# Patient Record
Sex: Male | Born: 1960 | Hispanic: No | Marital: Married | Smoking: Never smoker
Health system: Southern US, Community
[De-identification: ages and names within clinical notes are randomized; demographics above are authoritative.]

## PROBLEM LIST (undated history)

## (undated) DIAGNOSIS — E119 Type 2 diabetes mellitus without complications: Secondary | ICD-10-CM

## (undated) DIAGNOSIS — E785 Hyperlipidemia, unspecified: Secondary | ICD-10-CM

## (undated) DIAGNOSIS — I10 Essential (primary) hypertension: Secondary | ICD-10-CM

## (undated) HISTORY — DX: Hyperlipidemia, unspecified: E78.5

## (undated) HISTORY — PX: COLONOSCOPY: SHX174

## (undated) HISTORY — DX: Type 2 diabetes mellitus without complications: E11.9

## (undated) HISTORY — PX: NO PAST SURGERIES: SHX2092

## (undated) HISTORY — PX: OTHER SURGICAL HISTORY: SHX169

---

## 2008-07-24 LAB — HM COLONOSCOPY: HM Colonoscopy: NORMAL

## 2010-06-30 ENCOUNTER — Emergency Department: Payer: Self-pay | Admitting: Emergency Medicine

## 2010-07-15 ENCOUNTER — Telehealth (INDEPENDENT_AMBULATORY_CARE_PROVIDER_SITE_OTHER): Payer: Self-pay | Admitting: *Deleted

## 2010-07-15 ENCOUNTER — Telehealth: Payer: Self-pay | Admitting: Cardiovascular Disease

## 2010-07-22 ENCOUNTER — Ambulatory Visit: Admit: 2010-07-22 | Payer: Self-pay | Admitting: Cardiovascular Disease

## 2010-08-13 NOTE — Progress Notes (Signed)
Summary: Appt  Phone Note Call from Patient Call back at Home Phone [phone]   Caller: Self Call For: Gollan Summary of Call: Pt does not want to schedule the Treadmill or a new consult appt. Initial call taken by: Harlon Flor,  July 15, 2010 11:45 AM

## 2010-08-13 NOTE — Progress Notes (Signed)
Summary: Called Pt  Phone Note Outgoing Call Call back at [facility] Phone [phone]   Call placed by: Harlon Flor,  July 15, 2010 11:34 AM Call placed to: Patient Summary of Call: LMOM TCB to schedule Treadmill and New Consult appt with Gollan. Initial call taken by: Harlon Flor,  July 15, 2010 11:35 AM     Appended Document: Called Pt Would let referring MD office know.

## 2013-04-30 ENCOUNTER — Ambulatory Visit: Payer: Self-pay | Admitting: General Practice

## 2013-05-12 ENCOUNTER — Ambulatory Visit: Payer: Self-pay | Admitting: General Practice

## 2013-06-11 ENCOUNTER — Ambulatory Visit: Payer: Self-pay | Admitting: General Practice

## 2014-07-18 LAB — HM DIABETES EYE EXAM

## 2014-08-16 ENCOUNTER — Ambulatory Visit (INDEPENDENT_AMBULATORY_CARE_PROVIDER_SITE_OTHER): Payer: BLUE CROSS/BLUE SHIELD | Admitting: Internal Medicine

## 2014-08-16 ENCOUNTER — Other Ambulatory Visit: Payer: BLUE CROSS/BLUE SHIELD

## 2014-08-16 ENCOUNTER — Encounter (INDEPENDENT_AMBULATORY_CARE_PROVIDER_SITE_OTHER): Payer: Self-pay

## 2014-08-16 ENCOUNTER — Encounter: Payer: Self-pay | Admitting: Internal Medicine

## 2014-08-16 VITALS — BP 132/80 | HR 78 | Temp 98.7°F | Resp 16 | Ht 67.0 in | Wt 149.0 lb

## 2014-08-16 DIAGNOSIS — E1165 Type 2 diabetes mellitus with hyperglycemia: Secondary | ICD-10-CM | POA: Insufficient documentation

## 2014-08-16 DIAGNOSIS — Z1159 Encounter for screening for other viral diseases: Secondary | ICD-10-CM

## 2014-08-16 DIAGNOSIS — J069 Acute upper respiratory infection, unspecified: Secondary | ICD-10-CM

## 2014-08-16 DIAGNOSIS — E11319 Type 2 diabetes mellitus with unspecified diabetic retinopathy without macular edema: Secondary | ICD-10-CM | POA: Insufficient documentation

## 2014-08-16 DIAGNOSIS — IMO0002 Reserved for concepts with insufficient information to code with codable children: Secondary | ICD-10-CM | POA: Insufficient documentation

## 2014-08-16 DIAGNOSIS — E118 Type 2 diabetes mellitus with unspecified complications: Secondary | ICD-10-CM

## 2014-08-16 DIAGNOSIS — J029 Acute pharyngitis, unspecified: Secondary | ICD-10-CM

## 2014-08-16 LAB — CBC WITH DIFFERENTIAL/PLATELET
BASOS PCT: 0.3 % (ref 0.0–3.0)
Basophils Absolute: 0 10*3/uL (ref 0.0–0.1)
EOS PCT: 1.8 % (ref 0.0–5.0)
Eosinophils Absolute: 0.1 10*3/uL (ref 0.0–0.7)
HEMATOCRIT: 47.4 % (ref 39.0–52.0)
Hemoglobin: 16.2 g/dL (ref 13.0–17.0)
LYMPHS PCT: 21.5 % (ref 12.0–46.0)
Lymphs Abs: 1 10*3/uL (ref 0.7–4.0)
MCHC: 34.2 g/dL (ref 30.0–36.0)
MCV: 88.2 fl (ref 78.0–100.0)
MONO ABS: 0.6 10*3/uL (ref 0.1–1.0)
Monocytes Relative: 13.5 % — ABNORMAL HIGH (ref 3.0–12.0)
NEUTROS ABS: 3 10*3/uL (ref 1.4–7.7)
Neutrophils Relative %: 62.9 % (ref 43.0–77.0)
Platelets: 153 10*3/uL (ref 150.0–400.0)
RBC: 5.37 Mil/uL (ref 4.22–5.81)
RDW: 13.7 % (ref 11.5–15.5)
WBC: 4.8 10*3/uL (ref 4.0–10.5)

## 2014-08-16 LAB — MICROALBUMIN / CREATININE URINE RATIO
Creatinine,U: 99.2 mg/dL
Microalb Creat Ratio: 1.2 mg/g (ref 0.0–30.0)
Microalb, Ur: 1.2 mg/dL (ref 0.0–1.9)

## 2014-08-16 LAB — COMPREHENSIVE METABOLIC PANEL
ALT: 27 U/L (ref 0–53)
AST: 18 U/L (ref 0–37)
Albumin: 4 g/dL (ref 3.5–5.2)
Alkaline Phosphatase: 107 U/L (ref 39–117)
BUN: 16 mg/dL (ref 6–23)
CO2: 34 mEq/L — ABNORMAL HIGH (ref 19–32)
Calcium: 9.6 mg/dL (ref 8.4–10.5)
Chloride: 100 mEq/L (ref 96–112)
Creatinine, Ser: 1.06 mg/dL (ref 0.40–1.50)
GFR: 76.98 mL/min (ref >60.00)
Glucose, Bld: 326 mg/dL — ABNORMAL HIGH (ref 70–99)
Potassium: 4.3 mEq/L (ref 3.5–5.1)
Sodium: 136 mEq/L (ref 135–145)
TOTAL PROTEIN: 7.2 g/dL (ref 6.0–8.3)
Total Bilirubin: 0.6 mg/dL (ref 0.2–1.2)

## 2014-08-16 LAB — HEMOGLOBIN A1C: HEMOGLOBIN A1C: 12.2 % — AB (ref 4.6–6.5)

## 2014-08-16 LAB — POCT RAPID STREP A (OFFICE): RAPID STREP A SCREEN: NEGATIVE

## 2014-08-16 LAB — HM DIABETES FOOT EXAM: HM Diabetic Foot Exam: NORMAL

## 2014-08-16 NOTE — Progress Notes (Signed)
Patient ID: George Gordon, male   DOB: [birth year], 54 y.o.   MRN: [id number] Patient Active Problem List   Diagnosis Date Noted  . Diabetes mellitus type 2 with complications 93/81/0175    Subjective:  CC:   Chief Complaint  Patient presents with  . Establish Care    HPI:   George Gordon a 54 y.o. male who presents to establish care and manage his daibetes.  He was rreferred by [employer],   TRI for 2 days , nasal drainage,  Wife had it first.  No fevers,  But had some myalgia,,  allergic to eggs,   No nausea  .  Scratchy throat,  Voice has deepened, some cough started yesterday   Father died of ruptured aortic aneurysm..  Mother died from ruptured brain aneurysm.  [place]  emigrated 5 years ago,   8 siblings,  Oct 13, 2007 sister died  Of complications from [place]  Had a CVA vs AMI 7 years earlier,   Had obesity and DM ,  Bother has DM also..    Last year diagnosed with type 2 DM with a BS of 300. Was shocked bs he had athletic entire life. Was fired for not taking medication by [facility] in [place].  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  soccer three times per week,  No lows  Was checking  blood sugars   variable readings from 130 to 220   Family History  Problem Relation Age of Onset  . Diabetes Brother      Review of Systems:  Patient denies headache, fevers, malaise, unintentional weight loss, skin rash, eye pain, sinus congestion and sinus pain, sore throat, dysphagia,  hemoptysis , cough, dyspnea, wheezing, chest pain, palpitations, orthopnea, edema, abdominal pain, nausea, melena, diarrhea, constipation, flank pain, dysuria, hematuria, urinary  Frequency, nocturia, numbness, tingling, seizures,  Focal weakness, Loss of consciousness,  Tremor, insomnia, depression, anxiety, and suicidal ideation.         Objective:  BP 132/80 mmHg  Pulse 78  Temp(Src) 98.7 F (37.1 C) (Oral)  Resp 16  Ht  5\' 7"  (1.702 m)  Wt 149 lb (67.586 kg)  BMI 23.33 kg/m2  SpO2 97%  General appearance: alert, cooperative and appears stated age Ears: normal TM's and external ear canals both ears Throat: lips, mucosa, and tongue normal; teeth and gums normal Neck: no adenopathy, no carotid bruit, supple, symmetrical, trachea midline and thyroid not enlarged, symmetric, no tenderness/mass/nodules Back: symmetric, no curvature. ROM normal. No CVA tenderness. Lungs: clear to auscultation bilaterally Heart: regular rate and rhythm, S1, S2 normal, no murmur, click, rub or gallop Abdomen: soft, non-tender; bowel sounds normal; no masses,  no organomegaly Pulses: 2+ and symmetric Skin: Skin color, texture, turgor normal. No rashes or lesions Lymph nodes: Cervical, supraclavicular, and axillary nodes normal. Foot exam:  Nails are well trimmed,  No callouses,  Sensation intact to microfilament   Assessment and Plan:  Diabetes mellitus type 2 with complications diabetes is uncontrolled currently due to patient deferring medication from former PCP a year ago. He will be brought back to start insulin.   Lab Results  Component Value Date   HGBA1C 12.2* 08/16/2014    Lab Results  Component Value Date   MICROALBUR 1.2 08/16/2014       Updated Medication List Outpatient Encounter Prescriptions as of 08/16/2014  Medication Sig  . folic acid (FOLVITE) 102 MCG  tablet Take 400 mcg by mouth daily.  . vitamin C (ASCORBIC ACID) 500 MG tablet Take 500 mg by mouth daily.  . metFORMIN (GLUCOPHAGE-XR) 500 MG 24 hr tablet Take 500 mg by mouth daily with breakfast.     Orders Placed This Encounter  Procedures  . CBC with Differential/Platelet  . Comprehensive metabolic panel  . Hepatitis C antibody  . Hemoglobin A1c  . Microalbumin / creatinine urine ratio  . POCT rapid strep A    No Follow-up on file.

## 2014-08-16 NOTE — Patient Instructions (Signed)
Diabetes and Standards of Medical Care Diabetes is complicated. You may find that your diabetes team includes a dietitian, nurse, diabetes educator, eye doctor, and more. To help everyone know what is going on and to help you get the care you deserve, the following schedule of care was developed to help keep you on track. Below are the tests, exams, vaccines, medicines, education, and plans you will need. HbA1c test This test shows how well you have controlled your glucose over the past 2-3 months. It is used to see if your diabetes management plan needs to be adjusted.   It is performed at least 2 times a year if you are meeting treatment goals.  It is performed 4 times a year if therapy has changed or if you are not meeting treatment goals. Blood pressure test  This test is performed at every routine medical visit. The goal is less than 140/90 mm Hg for most people, but 130/80 mm Hg in some cases. Ask your health care provider about your goal. Dental exam  Follow up with the dentist regularly. Eye exam  If you are diagnosed with type 1 diabetes as a child, get an exam upon reaching the age of 74 years or older and have had diabetes for 3-5 years. Yearly eye exams are recommended after that initial eye exam.  If you are diagnosed with type 1 diabetes as an adult, get an exam within 5 years of diagnosis and then yearly.  If you are diagnosed with type 2 diabetes, get an exam as soon as possible after the diagnosis and then yearly. Foot care exam  Visual foot exams are performed at every routine medical visit. The exams check for cuts, injuries, or other problems with the feet.  A comprehensive foot exam should be done yearly. This includes visual inspection as well as assessing foot pulses and testing for loss of sensation.  Check your feet nightly for cuts, injuries, or other problems with your feet. Tell your health care provider if anything is not healing. Kidney function test (urine  microalbumin)  This test is performed once a year.  Type 1 diabetes: The first test is performed 5 years after diagnosis.  Type 2 diabetes: The first test is performed at the time of diagnosis.  A serum creatinine and estimated glomerular filtration rate (eGFR) test is done once a year to assess the level of chronic kidney disease (CKD), if present. Lipid profile (cholesterol, HDL, LDL, triglycerides)  Performed every 5 years for most people.  The goal for LDL is less than 100 mg/dL. If you are at high risk, the goal is less than 70 mg/dL.  The goal for HDL is 40 mg/dL-50 mg/dL for men and 50 mg/dL-60 mg/dL for women. An HDL cholesterol of 60 mg/dL or higher gives some protection against heart disease.  The goal for triglycerides is less than 150 mg/dL. Influenza vaccine, pneumococcal vaccine, and hepatitis B vaccine  The influenza vaccine is recommended yearly.  It is recommended that people with diabetes who are over 46 years old get the pneumonia vaccine. In some cases, two separate shots may be given. Ask your health care provider if your pneumonia vaccination is up to date.  The hepatitis B vaccine is also recommended for adults with diabetes. Diabetes self-management education  Education is recommended at diagnosis and ongoing as needed. Treatment plan  Your treatment plan is reviewed at every medical visit. Document Released: 04/25/2009 Document Revised: 11/12/2013 Document Reviewed: 11/28/2012 [facility] Patient Information 2015 [place]. This information is not intended to replace advice given to you by your health care provider. Make sure you discuss any questions you have with your health care provider.   This is  my version of a  "Low GI"  Weight loss Diet:  It will allow you to lose 4 to 8  lbs  per month if you follow it carefully.  Your goal with exercise is a minimum of 30 minutes of aerobic exercise 5 days per week (Walking does not count once it becomes  easy!)     All of the foods can be found at grocery stores and in bulk at [employer].  The Atkins protein bars and shakes are available in more varieties at Target, [employer] and [place].     7 AM Breakfast:  Choose from the following:  Low carbohydrate Protein  Shakes (I recommend the EAS AdvantEdge "Carb Control" shakes, Atkins,  Muscle Milk or Premier Protein shakes  All are < 4  carbs   a scrambled egg/bacon/cheese burrito made with Mission's "carb balance" whole wheat tortilla  (about 10 net carbs )  A slice of home made fritatta (egg based dish without a crust:  google it)    Avoid cereal and bananas, oatmeal and cream of wheat and grits. They are loaded with carbohydrates!   10 AM: high protein snack  Protein bar by Atkins  Or KIND  (the snack size, under 200 cal, usually < 6 net carbs).    A stick of cheese:  Around 1 carb,  100 cal      Other so called "protein bars" and Greek yogurts tend to be loaded with carbohydrates.  Remember, in food advertising, the word "energy" is synonymous for " carbohydrate."  Lunch:   A Sandwich using the bread choices listed, Can use any  Eggs,  lunchmeat, grilled meat or canned tuna), avocado, regular mayo/mustard  and cheese.  A Salad using blue cheese, ranch,  Goddess or vinagrette,  No croutons or "confetti" and no "candied nuts" but regular nuts OK.   No pretzels or chips.  Pickles and miniature sweet peppers are a good low carb alternative that provide a "crunch"  The bread is the only source of carbohydrate in a sandwich and  can be decreased by trying some of these alternatives to traditional loaf bread  Joseph's makes a pita bread and a flat bread that are 50 cal and 4 net carbs available at [place] and [place].  This can be toasted to use with hummous as well  Toufayan makes a low carb flatbread that's 100 cal and 9 net carbs available at [employer] and [employer]J's makes 2 sizes of  Low carb whole wheat tortilla  (The large one is 210 cal  and 6 net carbs)  Flat Out makes flatbreads that are low carb as well  Avoid "Low fat dressings, as well as Barry Brunner and [place] dressings They are loaded with sugar!   3 PM/ Mid day  Snack:  Consider  1 ounce of  almonds, walnuts, pistachios, pecans, peanuts,  Macadamia nuts or a nut medley.  Avoid "granola"; the dried cranberries and raisins are loaded with carbohydrates. Mixed nuts as long as there are no raisins,  cranberries or dried fruit.    Try the prosciutto/mozzarella cheese sticks by [employer]ci  In deli /backery section   High protein   To avoid overindulging in snacks: Try drinking a glass of unsweeted almond/coconut milk  Or a  cup of coffee with your Atkins chocolate bar to keep you from having 3!!!   Pork rinds!  Yes Pork Rinds        6 PM  Dinner:     Meat/fowl/fish with a green salad, and either broccoli, cauliflower, green beans, spinach, brussel sprouts or  Lima beans. DO NOT BREAD THE PROTEIN!!      There is a low carb pasta by Dreamfield's that is acceptable and tastes great: only 5 digestible carbs/serving.( All grocery stores but BJs carry it )  There are some prepared meals that are low carb:  Try Michel Angelo's chicken piccata or chicken or eggplant parm over low carb pasta.(Lowes and BJs)   Marjory Lies Sanchez's "Carnitas" (pulled pork, no sauce,  0 carbs) or his beef pot roast to make a dinner burrito (at BJ's)  Pesto over low carb pasta (bj's sells a good quality pesto in the center refrigerated section of the deli   Try satueeing  Cheral Marker with mushroooms  Whole wheat pasta is still full of digestible carbs and  Not as low in glycemic index as Dreamfield's.   Brown rice is still rice,  So skip the rice and noodles if you eat [place] or [place] (or at least limit to 1/2 cup)  9 PM snack :   Breyer's "low carb" fudgsicle or  ice cream bar (Carb Smart line), or  Weight Watcher's ice cream bar , or another "no sugar added" ice cream;  a serving of fresh  berries/cherries with whipped cream   Cheese or DANNON'S LlGHT N FIT GREEK YOGURT,  Oikos Triple Zero greek yogurt , or Chobani 100 yogurt   8 ounces of [place] unsweetened almond/cococunut milk  Cheese and crackers (using WASA crackers,  They are low carb) or peanut butter on low carb crackers or pita bread     Avoid bananas, pineapple, grapes  and watermelon on a regular basis because they are high in sugar.  THINK OF THEM AS DESSERT  Remember that snack Substitutions should be less than 10 NET carbs per serving and meals should be < 20 net carbs. Remember that carbohydrates from fiber do not affect blood sugar, so you can  subtract fiber grams to get the "net carbs " of any particular food item.

## 2014-08-17 LAB — HEPATITIS C ANTIBODY: HCV AB: NEGATIVE

## 2014-08-18 DIAGNOSIS — J069 Acute upper respiratory infection, unspecified: Secondary | ICD-10-CM | POA: Insufficient documentation

## 2014-08-18 DIAGNOSIS — B9789 Other viral agents as the cause of diseases classified elsewhere: Secondary | ICD-10-CM

## 2014-08-18 NOTE — Assessment & Plan Note (Signed)
Rapid strep was negative ,  Doe to  Investigate sort thoroat.  Treat for symptoms.

## 2014-08-18 NOTE — Assessment & Plan Note (Addendum)
diabetes is uncontrolled currently due to patient deferring medication from former PCP a year ago. He will be brought back to start insulin.   Lab Results  Component Value Date   HGBA1C 12.2* 08/16/2014    Lab Results  Component Value Date   MICROALBUR 1.2 08/16/2014   Lab Results  Component Value Date   CREATININE 1.06 08/16/2014

## 2014-08-23 ENCOUNTER — Telehealth: Payer: Self-pay

## 2014-08-23 MED ORDER — AMOXICILLIN-POT CLAVULANATE 875-125 MG PO TABS: 1.0000 | ORAL_TABLET | Freq: Two times a day (BID) | ORAL | Status: DC

## 2014-08-23 NOTE — Telephone Encounter (Signed)
Please advise 

## 2014-08-23 NOTE — Telephone Encounter (Signed)
augmentin twice daily for 7 days,  Sent to [facility] .  Please take a probiotic ( Align, Floraque or Culturelle) while you are on the antibiotic to prevent a serious antibiotic associated diarrhea  Called clostirudium dificile colitis

## 2014-08-23 NOTE — Telephone Encounter (Signed)
Spoke with pt advised of MDs message.  Pt verbalized understanding. 

## 2014-08-23 NOTE — Telephone Encounter (Signed)
The patient called and stated he was not feeling better and is hoping an antibiotic can be called in for him. I explained our office policy, however, he stated he would like to see if a medication could be called in.

## 2014-09-06 ENCOUNTER — Ambulatory Visit (INDEPENDENT_AMBULATORY_CARE_PROVIDER_SITE_OTHER): Payer: BLUE CROSS/BLUE SHIELD | Admitting: Internal Medicine

## 2014-09-06 ENCOUNTER — Encounter: Payer: Self-pay | Admitting: Internal Medicine

## 2014-09-06 VITALS — BP 128/86 | HR 72 | Temp 98.1°F | Resp 14 | Ht 67.0 in | Wt 152.0 lb

## 2014-09-06 DIAGNOSIS — E118 Type 2 diabetes mellitus with unspecified complications: Secondary | ICD-10-CM

## 2014-09-06 DIAGNOSIS — IMO0002 Reserved for concepts with insufficient information to code with codable children: Secondary | ICD-10-CM

## 2014-09-06 DIAGNOSIS — Z23 Encounter for immunization: Secondary | ICD-10-CM

## 2014-09-06 DIAGNOSIS — E1165 Type 2 diabetes mellitus with hyperglycemia: Secondary | ICD-10-CM

## 2014-09-06 MED ORDER — METFORMIN HCL ER 500 MG PO TB24: 500.0000 mg | ORAL_TABLET | Freq: Every day | ORAL | Status: DC

## 2014-09-06 NOTE — Patient Instructions (Signed)
Resume metformin Er 500 mg once daily  continue to check sugars once or twice daily maximum .  Use fasting and post prandials as  good times to checks  E mail me in 3 weeks with blood sugars so we can adjust medication   Premiier Protein and Muscle Milk are both excellent premixed protein shakes that are great for breakfast on the run

## 2014-09-08 NOTE — Progress Notes (Signed)
Patient ID: George Gordon, male   DOB: August 30, 1960, 54 y.o.   MRN: [id number]   Patient Active Problem List   Diagnosis Date Noted  . Viral URI 08/18/2014  . Diabetes mellitus type 2, uncontrolled, with complications 87/56/4332    Subjective:  CC:   Chief Complaint  Patient presents with  . Follow-up  . Diabetes    HPI:   George Gordon is a 54 y.o. male who presents for   1 month follow up on uncontrolled diabetes.  Patient has no complaints today.  Patient is following a low glycemic index diet and checking his sugars twice daily.  Fasting sugars have been under 200 for the last 2 weeks,  And usually less than 160 most of the time and post prandials have been under 200 except on rare occasions. Patient is not exercising yet due to work load .  Patient has had an eye exam in the last 12 months and checks feet regularly for signs of infection.  Patient does not walk barefoot outside,  And denies any numbness tingling or burning in feet. Patient is up to date on all recommended vaccinations   Past Medical History  Diagnosis Date  . Diabetes mellitus without complication   . Hyperlipidemia     No past surgical history on file.     The following portions of the patient's history were reviewed and updated as appropriate: Allergies, current medications, and problem list.    Review of Systems:   Patient denies headache, fevers, malaise, unintentional weight loss, skin rash, eye pain, sinus congestion and sinus pain, sore throat, dysphagia,  hemoptysis , cough, dyspnea, wheezing, chest pain, palpitations, orthopnea, edema, abdominal pain, nausea, melena, diarrhea, constipation, flank pain, dysuria, hematuria, urinary  Frequency, nocturia, numbness, tingling, seizures,  Focal weakness, Loss of consciousness,  Tremor, insomnia, depression, anxiety, and suicidal ideation.     History   Social History  . Marital Status: Single    Spouse Name: N/A  . Number of Children: N/A  .  Years of Education: N/A   Occupational History  . Not on file.   Social History Main Topics  . Smoking status: Never Smoker   . Smokeless tobacco: Never Used  . Alcohol Use: 0.0 oz/week    0 Standard drinks or equivalent per week     Comment: Rarely  . Drug Use: Not on file  . Sexual Activity: Yes   Other Topics Concern  . Not on file   Social History Narrative    Objective:  Filed Vitals:   09/06/14 1538  BP: 128/86  Pulse: 72  Temp: 98.1 F (36.7 C)  Resp: 14     General appearance: alert, cooperative and appears stated age Ears: normal TM's and external ear canals both ears Throat: lips, mucosa, and tongue normal; teeth and gums normal Neck: no adenopathy, no carotid bruit, supple, symmetrical, trachea midline and thyroid not enlarged, symmetric, no tenderness/mass/nodules Back: symmetric, no curvature. ROM normal. No CVA tenderness. Lungs: clear to auscultation bilaterally Heart: regular rate and rhythm, S1, S2 normal, no murmur, click, rub or gallop Abdomen: soft, non-tender; bowel sounds normal; no masses,  no organomegaly Pulses: 2+ and symmetric Skin: Skin color, texture, turgor normal. No rashes or lesions Lymph nodes: Cervical, supraclavicular, and axillary nodes normal.  Assessment and Plan:  Diabetes mellitus type 2, uncontrolled, with complications Starting metformin ,  Blood sugars have already improved with attention to diet.  Return in 3 months.  Use Mychart for follow up in 3  weeks  Lab Results  Component Value Date   HGBA1C 12.2* 08/16/2014   Lab Results  Component Value Date   MICROALBUR 1.2 08/16/2014   No results found for: CHOL, HDL, LDLCALC, LDLDIRECT, TRIG, CHOLHDL     Updated Medication List Outpatient Encounter Prescriptions as of 09/06/2014  Medication Sig  . folic acid (FOLVITE) 268 MCG tablet Take 400 mcg by mouth daily.  . vitamin C (ASCORBIC ACID) 500 MG tablet Take 500 mg by mouth daily.  Marland Kitchen amoxicillin-clavulanate  (AUGMENTIN) 875-125 MG per tablet Take 1 tablet by mouth 2 (two) times daily. (Patient not taking: Reported on 09/06/2014)  . metFORMIN (GLUCOPHAGE-XR) 500 MG 24 hr tablet Take 1 tablet (500 mg total) by mouth daily with breakfast.  . [DISCONTINUED] metFORMIN (GLUCOPHAGE-XR) 500 MG 24 hr tablet Take 500 mg by mouth daily with breakfast.     Orders Placed This Encounter  Procedures  . Pneumococcal polysaccharide vaccine 23-valent greater than or equal to 2yo subcutaneous/IM    Return in about 3 months (around 12/05/2014) for follow up diabetes.

## 2014-09-08 NOTE — Assessment & Plan Note (Signed)
Starting metformin ,  Blood sugars have already improved with attention to diet.  Return in 3 months.  Use Mychart for follow up in 3 weeks  Lab Results  Component Value Date   HGBA1C 12.2* 08/16/2014   Lab Results  Component Value Date   MICROALBUR 1.2 08/16/2014   No results found for: CHOL, HDL, LDLCALC, LDLDIRECT, TRIG, CHOLHDL

## 2015-01-07 ENCOUNTER — Telehealth: Payer: Self-pay | Admitting: Internal Medicine

## 2015-01-07 NOTE — Telephone Encounter (Signed)
Pt would like to know if you will take his wife as a new pt [name]

## 2015-06-24 LAB — HM DIABETES EYE EXAM

## 2015-07-17 ENCOUNTER — Telehealth: Payer: Self-pay | Admitting: Internal Medicine

## 2015-07-17 MED ORDER — GLUCOSE BLOOD VI STRP: ORAL_STRIP | Status: DC

## 2015-07-17 NOTE — Telephone Encounter (Signed)
Pt called about needing test strips refill. Pharmacy refused to sell the strip on the flex card he was told he needs a prescription. Pt would like a call back once it's sent in. Pharmacy is [facility] [id number] - [place], [place]. Thank You!

## 2015-07-22 ENCOUNTER — Ambulatory Visit: Payer: BLUE CROSS/BLUE SHIELD | Admitting: Internal Medicine

## 2015-07-23 NOTE — Progress Notes (Signed)
Patient arrived 32 minutes late for an appointment and will be billed a no show fee.

## 2015-07-23 NOTE — Assessment & Plan Note (Signed)
Patient failed to keep scheduled appointment and will be charged a no show fee.   

## 2015-07-25 ENCOUNTER — Ambulatory Visit (INDEPENDENT_AMBULATORY_CARE_PROVIDER_SITE_OTHER): Payer: BLUE CROSS/BLUE SHIELD | Admitting: Internal Medicine

## 2015-07-25 ENCOUNTER — Encounter: Payer: Self-pay | Admitting: Internal Medicine

## 2015-07-25 VITALS — BP 118/74 | HR 67 | Temp 98.1°F | Resp 12 | Ht 67.0 in | Wt 151.0 lb

## 2015-07-25 DIAGNOSIS — E1169 Type 2 diabetes mellitus with other specified complication: Secondary | ICD-10-CM

## 2015-07-25 DIAGNOSIS — E785 Hyperlipidemia, unspecified: Secondary | ICD-10-CM

## 2015-07-25 DIAGNOSIS — E118 Type 2 diabetes mellitus with unspecified complications: Secondary | ICD-10-CM | POA: Diagnosis not present

## 2015-07-25 DIAGNOSIS — Z79899 Other long term (current) drug therapy: Secondary | ICD-10-CM

## 2015-07-25 DIAGNOSIS — E1165 Type 2 diabetes mellitus with hyperglycemia: Secondary | ICD-10-CM

## 2015-07-25 DIAGNOSIS — IMO0002 Reserved for concepts with insufficient information to code with codable children: Secondary | ICD-10-CM

## 2015-07-25 DIAGNOSIS — E559 Vitamin D deficiency, unspecified: Secondary | ICD-10-CM | POA: Diagnosis not present

## 2015-07-25 LAB — COMPREHENSIVE METABOLIC PANEL
ALT: 31 U/L (ref 0–53)
AST: 21 U/L (ref 0–37)
Albumin: 3.9 g/dL (ref 3.5–5.2)
Alkaline Phosphatase: 104 U/L (ref 39–117)
BILIRUBIN TOTAL: 0.9 mg/dL (ref 0.2–1.2)
BUN: 17 mg/dL (ref 6–23)
CALCIUM: 9.5 mg/dL (ref 8.4–10.5)
CO2: 32 meq/L (ref 19–32)
Chloride: 99 mEq/L (ref 96–112)
Creatinine, Ser: 1.01 mg/dL (ref 0.40–1.50)
GFR: 81.11 mL/min (ref >60.00)
GLUCOSE: 242 mg/dL — AB (ref 70–99)
Potassium: 4.2 mEq/L (ref 3.5–5.1)
Sodium: 138 mEq/L (ref 135–145)
Total Protein: 7 g/dL (ref 6.0–8.3)

## 2015-07-25 LAB — VITAMIN B12: Vitamin B-12: 1094 pg/mL — ABNORMAL HIGH (ref 211–911)

## 2015-07-25 LAB — LIPID PANEL
CHOL/HDL RATIO: 6
Cholesterol: 214 mg/dL — ABNORMAL HIGH (ref 0–200)
HDL: 38.1 mg/dL — AB (ref >39.00)
LDL Cholesterol: 157 mg/dL — ABNORMAL HIGH (ref 0–99)
NONHDL: 176.37
Triglycerides: 97 mg/dL (ref 0.0–149.0)
VLDL: 19.4 mg/dL (ref 0.0–40.0)

## 2015-07-25 LAB — HEMOGLOBIN A1C: Hgb A1c MFr Bld: 10.8 % — ABNORMAL HIGH (ref 4.6–6.5)

## 2015-07-25 LAB — LDL CHOLESTEROL, DIRECT: Direct LDL: 159 mg/dL

## 2015-07-25 LAB — MICROALBUMIN / CREATININE URINE RATIO
Creatinine,U: 166.7 mg/dL
Microalb Creat Ratio: 0.9 mg/g (ref 0.0–30.0)
Microalb, Ur: 1.5 mg/dL (ref 0.0–1.9)

## 2015-07-25 LAB — VITAMIN D 25 HYDROXY (VIT D DEFICIENCY, FRACTURES): VITD: 23.28 ng/mL — ABNORMAL LOW (ref 30.00–100.00)

## 2015-07-25 NOTE — Patient Instructions (Addendum)
Please Check to see whether you had the TDaP o t  Td vaccine in the last 10 years. (tetanus with or without pertussis) .  This vaccine should be give every 10 years   If your A1c is < 7.0  You will not need medications and can see me every 3 months,  If it is over 7.0,  We will start a new medication and follow up in 3 months

## 2015-07-25 NOTE — Progress Notes (Signed)
Subjective:  Patient ID: George Gordon, male    DOB: [birth year]  Age: 55 y.o. MRN: [id number]  CC: The primary encounter diagnosis was Uncontrolled type 2 diabetes mellitus with complication, without long-term current use of insulin ([place]). Diagnoses of Long-term use of high-risk medication, Vitamin D deficiency, and Hyperlipidemia due to type 2 diabetes mellitus ([place]) were also pertinent to this visit.  HPI George Gordon presents for FOLLOW UP ON TYPE 2 DM,  Uncontrolled at last visit 11 MONTHS AGO,  Has been lost to follow up  Lab Results  Component Value Date   HGBA1C 10.8* 07/25/2015     He reports that stopped metformin over a month ago due to fatigue. , and his  symptoms have improved since stopping it. Sleeping better.     Has been checking BS  twice daily,   Highest has been 263 after  A night of drinking peach schnapps. Reports that his fastings have been 100 to 140 .  Following a low glycemic index diet.  Avoids eating out. Plays soccer several times per week (3) 60 to 90 minutes   Takes zyrtec daily for allergies related to animal dander .  He has had a diabetic eye exam in the last year.    Foot exam normal     Outpatient Prescriptions Prior to Visit  Medication Sig Dispense Refill  . glucose blood test strip Use as instructed to check Blood Glucose once a day. E11.8 100 each 12  . amoxicillin-clavulanate (AUGMENTIN) 875-125 MG per tablet Take 1 tablet by mouth 2 (two) times daily. (Patient not taking: Reported on 09/06/2014) 14 tablet 0  . folic acid (FOLVITE) A999333 MCG tablet Take 400 mcg by mouth daily. Reported on 07/25/2015    . metFORMIN (GLUCOPHAGE-XR) 500 MG 24 hr tablet Take 1 tablet (500 mg total) by mouth daily with breakfast. (Patient not taking: Reported on 07/25/2015) 90 tablet 1  . vitamin C (ASCORBIC ACID) 500 MG tablet Take 500 mg by mouth daily. Reported on 07/25/2015     No facility-administered medications prior to visit.    Review of  Systems;  Patient denies headache, fevers, malaise, unintentional weight loss, skin rash, eye pain, sinus congestion and sinus pain, sore throat, dysphagia,  hemoptysis , cough, dyspnea, wheezing, chest pain, palpitations, orthopnea, edema, abdominal pain, nausea, melena, diarrhea, constipation, flank pain, dysuria, hematuria, urinary  Frequency, nocturia, numbness, tingling, seizures,  Focal weakness, Loss of consciousness,  Tremor, insomnia, depression, anxiety, and suicidal ideation.      Objective:  BP 118/74 mmHg  Pulse 67  Temp(Src) 98.1 F (36.7 C) (Oral)  Resp 12  Ht 5\' 7"  (1.702 m)  Wt 151 lb (68.493 kg)  BMI 23.64 kg/m2  SpO2 98%  BP Readings from Last 3 Encounters:  07/25/15 118/74  09/06/14 128/86  08/16/14 132/80    Wt Readings from Last 3 Encounters:  07/25/15 151 lb (68.493 kg)  09/06/14 152 lb (68.947 kg)  08/16/14 149 lb (67.586 kg)    General appearance: alert, cooperative and appears stated age Ears: normal TM's and external ear canals both ears Throat: lips, mucosa, and tongue normal; teeth and gums normal Neck: no adenopathy, no carotid bruit, supple, symmetrical, trachea midline and thyroid not enlarged, symmetric, no tenderness/mass/nodules Back: symmetric, no curvature. ROM normal. No CVA tenderness. Lungs: clear to auscultation bilaterally Heart: regular rate and rhythm, S1, S2 normal, no murmur, click, rub or gallop Abdomen: soft, non-tender; bowel sounds normal; no masses,  no organomegaly Pulses: 2+ and symmetric  Skin: Skin color, texture, turgor normal. No rashes or lesions Lymph nodes: Cervical, supraclavicular, and axillary nodes normal.  Lab Results  Component Value Date   HGBA1C 10.8* 07/25/2015   HGBA1C 12.2* 08/16/2014    Lab Results  Component Value Date   CREATININE 1.01 07/25/2015   CREATININE 1.06 08/16/2014    Lab Results  Component Value Date   WBC 4.8 08/16/2014   HGB 16.2 08/16/2014   HCT 47.4 08/16/2014   PLT 153.0  08/16/2014   GLUCOSE 242* 07/25/2015   CHOL 214* 07/25/2015   TRIG 97.0 07/25/2015   HDL 38.10* 07/25/2015   LDLDIRECT 159.0 07/25/2015   LDLCALC 157* 07/25/2015   ALT 31 07/25/2015   AST 21 07/25/2015   NA 138 07/25/2015   K 4.2 07/25/2015   CL 99 07/25/2015   CREATININE 1.01 07/25/2015   BUN 17 07/25/2015   CO2 32 07/25/2015   HGBA1C 10.8* 07/25/2015   MICROALBUR 1.5 07/25/2015    No results found.  Assessment & Plan:   Problem List Items Addressed This Visit    Diabetes mellitus type 2, uncontrolled, with complications ([place]) - Primary     diabetes is under poor control on diet alone.  Starting glipizide 5 mg twice daily and advised to return in one month for an office visit to discuss additional changes change in therapy,   And bring a log of the last week of blood sugars.  Adding ASA and advising use of statin today.   Lab Results  Component Value Date   HGBA1C 10.8* 07/25/2015    Lab Results  Component Value Date   MICROALBUR 1.5 07/25/2015         Relevant Medications   aspirin EC 81 MG tablet   atorvastatin (LIPITOR) 20 MG tablet   Other Relevant Orders   Comprehensive metabolic panel (Completed)   Hemoglobin A1c (Completed)   LDL cholesterol, direct (Completed)   Lipid panel (Completed)   Microalbumin / creatinine urine ratio (Completed)   Hyperlipidemia due to type 2 diabetes mellitus ([facility])    Given his concurrent history of diabetes mellitus , will recommend statin therapy as his 10 year risk is 22%        Relevant Medications   aspirin EC 81 MG tablet   atorvastatin (LIPITOR) 20 MG tablet    Other Visit Diagnoses    Long-term use of high-risk medication        Relevant Orders    Vitamin B12 (Completed)    Vitamin D deficiency        Relevant Orders    VITAMIN D 25 Hydroxy (Vit-D Deficiency, Fractures) (Completed)       I am having [name] start on aspirin EC and atorvastatin. I am also having him maintain his vitamin C, folic acid,  amoxicillin-clavulanate, metFORMIN, and glucose blood.  Meds ordered this encounter  Medications  . aspirin EC 81 MG tablet    Sig: Take 1 tablet (81 mg total) by mouth daily.    Dispense:  90 tablet    Refill:  3  . atorvastatin (LIPITOR) 20 MG tablet    Sig: Take 1 tablet (20 mg total) by mouth daily.    Dispense:  90 tablet    Refill:  0  A total of 25 minutes of face to face time was spent with patient more than half of which was spent in counselling about the above mentioned conditions  and coordination of care   There are no discontinued medications.  Follow-up:  Return in about 3 months (around 10/23/2015) for follow up diabetes.   Crecencio Mc, MD

## 2015-07-25 NOTE — Progress Notes (Signed)
Pre-visit discussion using our clinic review tool. No additional management support is needed unless otherwise documented below in the visit note.  

## 2015-07-27 DIAGNOSIS — E1169 Type 2 diabetes mellitus with other specified complication: Secondary | ICD-10-CM | POA: Insufficient documentation

## 2015-07-27 DIAGNOSIS — E785 Hyperlipidemia, unspecified: Secondary | ICD-10-CM

## 2015-07-27 MED ORDER — ASPIRIN EC 81 MG PO TBEC: 81.0000 mg | DELAYED_RELEASE_TABLET | Freq: Every day | ORAL | Status: DC

## 2015-07-27 MED ORDER — ATORVASTATIN CALCIUM 20 MG PO TABS: 20.0000 mg | ORAL_TABLET | Freq: Every day | ORAL | Status: DC

## 2015-07-27 NOTE — Assessment & Plan Note (Addendum)
diabetes is under poor control on diet alone.  Starting glipizide 5 mg twice daily and advised to return in one month for an office visit to discuss additional changes change in therapy,   And bring a log of the last week of blood sugars.  Adding ASA and advising use of statin today.   Lab Results  Component Value Date   HGBA1C 10.8* 07/25/2015    Lab Results  Component Value Date   MICROALBUR 1.5 07/25/2015

## 2015-07-27 NOTE — Assessment & Plan Note (Signed)
Given his concurrent history of diabetes mellitus , will recommend statin therapy as his 10 year risk is 22%

## 2015-08-18 ENCOUNTER — Telehealth: Payer: Self-pay

## 2015-08-18 MED ORDER — GLUCOSE BLOOD VI STRP: ORAL_STRIP | Status: DC

## 2015-08-18 NOTE — Telephone Encounter (Signed)
Pt states that he has a rash on the back of his neck x1week. Pt states he just started taking Lipitor 20mg , not sure if this the cause of the rash. Please advise

## 2015-08-18 NOTE — Telephone Encounter (Signed)
Have him suspend the lipitor for two weeks.  If rash resolves,  Resume the.  Lipitor.   If the  rash comes back, we know it's the lipitor.

## 2015-08-19 NOTE — Telephone Encounter (Signed)
Notified pt of Dr. Lupita Dawn comments

## 2015-08-26 ENCOUNTER — Ambulatory Visit (INDEPENDENT_AMBULATORY_CARE_PROVIDER_SITE_OTHER): Payer: BLUE CROSS/BLUE SHIELD | Admitting: Family Medicine

## 2015-08-26 ENCOUNTER — Encounter: Payer: Self-pay | Admitting: Family Medicine

## 2015-08-26 VITALS — BP 142/88 | HR 96 | Temp 100.1°F | Wt 154.0 lb

## 2015-08-26 DIAGNOSIS — J111 Influenza due to unidentified influenza virus with other respiratory manifestations: Secondary | ICD-10-CM | POA: Insufficient documentation

## 2015-08-26 LAB — POCT INFLUENZA A/B
INFLUENZA A, POC: POSITIVE — AB
INFLUENZA B, POC: POSITIVE — AB

## 2015-08-26 MED ORDER — OSELTAMIVIR PHOSPHATE 75 MG PO CAPS: 75.0000 mg | ORAL_CAPSULE | Freq: Two times a day (BID) | ORAL | Status: DC

## 2015-08-26 NOTE — Progress Notes (Signed)
   Subjective:  Patient ID: George Gordon, male    DOB: [birth year]  Age: 55 y.o. MRN: [id number]  CC: Fever, cough, body aches  HPI:  55 year old male presents to clinic today with the above complaints.  Patient states that he's been sick since Monday. He states that he developed muscle aches/body aches on Monday as well as fever and chills. He reports associated cough and nasal drainage. He also reports headache. No exacerbating or relieving factors. No medications or interventions tried. No other complaints today.  Social Hx   Social History   Social History  . Marital Status: Single    Spouse Name: N/A  . Number of Children: N/A  . Years of Education: N/A   Social History Main Topics  . Smoking status: Never Smoker   . Smokeless tobacco: Never Used  . Alcohol Use: 0.0 oz/week    0 Standard drinks or equivalent per week     Comment: Rarely  . Drug Use: None  . Sexual Activity: Yes   Other Topics Concern  . None   Social History Narrative   Review of Systems  Constitutional: Positive for fever and chills.  HENT: Positive for rhinorrhea.   Respiratory: Positive for cough.   Musculoskeletal:       Body aches.    Objective:  BP 142/88 mmHg  Pulse 96  Temp(Src) 100.1 F (37.8 C) (Oral)  Wt 154 lb (69.854 kg)  SpO2 94%  BP/Weight 08/26/2015 07/25/2015 [id number]  Systolic BP A999333 [id number]  Diastolic BP 88 74 86  Wt. (Lbs) 154 151 152  BMI 24.11 23.64 23.8   Physical Exam  Constitutional: He is oriented to person, place, and time. He appears well-developed.  NAD.   HENT:  Head: Normocephalic and atraumatic.  Mouth/Throat: Oropharynx is clear and moist.  Normal TM's bilaterally.   Neck: Neck supple.  Cardiovascular: Tachycardia present.   Pulmonary/Chest: Effort normal and breath sounds normal. No respiratory distress. He has no wheezes. He has no rales.  Neurological: He is alert and oriented to person, place, and time.  Psychiatric: He has a normal mood and  affect.  Vitals reviewed.  Lab Results  Component Value Date   WBC 4.8 08/16/2014   HGB 16.2 08/16/2014   HCT 47.4 08/16/2014   PLT 153.0 08/16/2014   GLUCOSE 242* 07/25/2015   CHOL 214* 07/25/2015   TRIG 97.0 07/25/2015   HDL 38.10* 07/25/2015   LDLDIRECT 159.0 07/25/2015   LDLCALC 157* 07/25/2015   ALT 31 07/25/2015   AST 21 07/25/2015   NA 138 07/25/2015   K 4.2 07/25/2015   CL 99 07/25/2015   CREATININE 1.01 07/25/2015   BUN 17 07/25/2015   CO2 32 07/25/2015   HGBA1C 10.8* 07/25/2015   MICROALBUR 1.5 07/25/2015    Assessment & Plan:   Problem List Items Addressed This Visit    Influenza - Primary    New problem. Rapid flu positive today. Within treatment window. Treating with Tamiflu.      Relevant Medications   oseltamivir (TAMIFLU) 75 MG capsule      Meds ordered this encounter  Medications  . oseltamivir (TAMIFLU) 75 MG capsule    Sig: Take 1 capsule (75 mg total) by mouth 2 (two) times daily.    Dispense:  10 capsule    Refill:  0   Follow-up: PRN  [place]

## 2015-08-26 NOTE — Assessment & Plan Note (Signed)
New problem. Rapid flu positive today. Within treatment window. Treating with Tamiflu.

## 2015-08-26 NOTE — Addendum Note (Signed)
Addended by: Carmin Muskrat on: 08/26/2015 04:17 PM   Modules accepted: Orders

## 2015-08-26 NOTE — Patient Instructions (Signed)
Take the tamiflu as prescribed.   Follow up as indicated.  Take care  Dr. Lacinda Axon

## 2015-08-26 NOTE — Progress Notes (Signed)
Pre visit review using our clinic review tool, if applicable. No additional management support is needed unless otherwise documented below in the visit note. 

## 2015-08-27 ENCOUNTER — Encounter: Payer: Self-pay | Admitting: Internal Medicine

## 2015-08-27 ENCOUNTER — Ambulatory Visit (INDEPENDENT_AMBULATORY_CARE_PROVIDER_SITE_OTHER): Payer: BLUE CROSS/BLUE SHIELD | Admitting: Internal Medicine

## 2015-08-27 VITALS — BP 140/80 | HR 75 | Temp 98.2°F | Resp 12 | Ht 67.0 in | Wt 148.5 lb

## 2015-08-27 DIAGNOSIS — E1165 Type 2 diabetes mellitus with hyperglycemia: Secondary | ICD-10-CM

## 2015-08-27 DIAGNOSIS — Z889 Allergy status to unspecified drugs, medicaments and biological substances status: Secondary | ICD-10-CM

## 2015-08-27 DIAGNOSIS — E1169 Type 2 diabetes mellitus with other specified complication: Secondary | ICD-10-CM

## 2015-08-27 DIAGNOSIS — J111 Influenza due to unidentified influenza virus with other respiratory manifestations: Secondary | ICD-10-CM | POA: Diagnosis not present

## 2015-08-27 DIAGNOSIS — E118 Type 2 diabetes mellitus with unspecified complications: Secondary | ICD-10-CM

## 2015-08-27 DIAGNOSIS — IMO0002 Reserved for concepts with insufficient information to code with codable children: Secondary | ICD-10-CM

## 2015-08-27 DIAGNOSIS — E785 Hyperlipidemia, unspecified: Secondary | ICD-10-CM

## 2015-08-27 DIAGNOSIS — Z789 Other specified health status: Secondary | ICD-10-CM

## 2015-08-27 DIAGNOSIS — E1140 Type 2 diabetes mellitus with diabetic neuropathy, unspecified: Secondary | ICD-10-CM

## 2015-08-27 DIAGNOSIS — E119 Type 2 diabetes mellitus without complications: Secondary | ICD-10-CM

## 2015-08-27 MED ORDER — GLIPIZIDE 5 MG PO TABS: 2.5000 mg | ORAL_TABLET | Freq: Two times a day (BID) | ORAL | Status: DC

## 2015-08-27 NOTE — Progress Notes (Signed)
Pre-visit discussion using our clinic review tool. No additional management support is needed unless otherwise documented below in the visit note.  

## 2015-08-27 NOTE — Patient Instructions (Signed)
Your blood sugars are improving but we are still not at goal. Your diet is excellent!!  I am adding glipizide 1/2 tablet (2.5 mg ) twice daily before breakfast and evening meal.  Increase the dose to 5 mg at dinner in 3 weeks  if your fastings are still > 140   Fasting labs are due on or after April 13

## 2015-08-27 NOTE — Progress Notes (Signed)
Subjective:  Patient ID: George Gordon, male    DOB: 06/29/1961  Age: 55 y.o. MRN: [id number]  CC: The primary encounter diagnosis was Diabetes mellitus without complication ([place]). Diagnoses of Influenza, Uncontrolled type 2 diabetes mellitus with complication, without long-term current use of insulin ([place]), Type 2 diabetes mellitus with diabetic neuropathy, without long-term current use of insulin ([place]), Hyperlipidemia due to type 2 diabetes mellitus ([place]), and Statin intolerance were also pertinent to this visit.  HPI George Gordon presents for diabetes follow up  He was diagnosed with flu yesterday, and started taking tamiflu last evening. . Symptoms started one day prior to presentation/  He returns today for follow up on uncontrolled diabetes and has not taken respiratory precuations by wearing a  face mask.  Staff exposed includes LPN Juliann Pulse and Administrator, sports.    Diet and blood sugars reviewed.  He is not taking metformin anymore, and no statin because a trial of lipitor caused a rash and indigestion .  His blood sugars are e;evated tp 160 in the am fasting and > 180 post prandially despite following a low GI diet. He has no low blood sugars to report.  He is exercising occasionally. Discussed  resuming glipzide 2.5 bid .    Lab Results  Component Value Date   HGBA1C 10.8* 07/25/2015     Outpatient Prescriptions Prior to Visit  Medication Sig Dispense Refill  . aspirin EC 81 MG tablet Take 1 tablet (81 mg total) by mouth daily. 90 tablet 3  . folic acid (FOLVITE) A999333 MCG tablet Take 400 mcg by mouth daily. Reported on 07/25/2015    . glucose blood test strip Use as instructed to check Blood Glucose once a day. E11.8 100 each 12  . oseltamivir (TAMIFLU) 75 MG capsule Take 1 capsule (75 mg total) by mouth 2 (two) times daily. 10 capsule 0  . vitamin C (ASCORBIC ACID) 500 MG tablet Take 500 mg by mouth daily. Reported on 07/25/2015    . amoxicillin-clavulanate (AUGMENTIN) 875-125  MG per tablet Take 1 tablet by mouth 2 (two) times daily. (Patient not taking: Reported on 08/27/2015) 14 tablet 0  . atorvastatin (LIPITOR) 20 MG tablet Take 1 tablet (20 mg total) by mouth daily. (Patient not taking: Reported on 08/27/2015) 90 tablet 0  . metFORMIN (GLUCOPHAGE-XR) 500 MG 24 hr tablet Take 1 tablet (500 mg total) by mouth daily with breakfast. (Patient not taking: Reported on 08/27/2015) 90 tablet 1   No facility-administered medications prior to visit.    Review of Systems;  Patient denies headache, fevers, malaise, unintentional weight loss, skin rash, eye pain, sinus congestion and sinus pain, sore throat, dysphagia,  hemoptysis , cough, dyspnea, wheezing, chest pain, palpitations, orthopnea, edema, abdominal pain, nausea, melena, diarrhea, constipation, flank pain, dysuria, hematuria, urinary  Frequency, nocturia, numbness, tingling, seizures,  Focal weakness, Loss of consciousness,  Tremor, insomnia, depression, anxiety, and suicidal ideation.      Objective:  BP 140/80 mmHg  Pulse 75  Temp(Src) 98.2 F (36.8 C) (Oral)  Resp 12  Ht 5\' 7"  (1.702 m)  Wt 148 lb 8 oz (67.359 kg)  BMI 23.25 kg/m2  SpO2 95%  BP Readings from Last 3 Encounters:  08/27/15 140/80  08/26/15 142/88  07/25/15 118/74    Wt Readings from Last 3 Encounters:  08/27/15 148 lb 8 oz (67.359 kg)  08/26/15 154 lb (69.854 kg)  07/25/15 151 lb (68.493 kg)    General appearance: alert, cooperative and appears stated age Ears:  normal TM's and external ear canals both ears Throat: lips, mucosa, and tongue normal; teeth and gums normal Neck: no adenopathy, no carotid bruit, supple, symmetrical, trachea midline and thyroid not enlarged, symmetric, no tenderness/mass/nodules Back: symmetric, no curvature. ROM normal. No CVA tenderness. Lungs: clear to auscultation bilaterally Heart: regular rate and rhythm, S1, S2 normal, no murmur, click, rub or gallop Abdomen: soft, non-tender; bowel sounds  normal; no masses,  no organomegaly Pulses: 2+ and symmetric Skin: Skin color, texture, turgor normal. No rashes or lesions Lymph nodes: Cervical, supraclavicular, and axillary nodes normal.  Lab Results  Component Value Date   HGBA1C 10.8* 07/25/2015   HGBA1C 12.2* 08/16/2014    Lab Results  Component Value Date   CREATININE 1.01 07/25/2015   CREATININE 1.06 08/16/2014    Lab Results  Component Value Date   WBC 4.8 08/16/2014   HGB 16.2 08/16/2014   HCT 47.4 08/16/2014   PLT 153.0 08/16/2014   GLUCOSE 242* 07/25/2015   CHOL 214* 07/25/2015   TRIG 97.0 07/25/2015   HDL 38.10* 07/25/2015   LDLDIRECT 159.0 07/25/2015   LDLCALC 157* 07/25/2015   ALT 31 07/25/2015   AST 21 07/25/2015   NA 138 07/25/2015   K 4.2 07/25/2015   CL 99 07/25/2015   CREATININE 1.01 07/25/2015   BUN 17 07/25/2015   CO2 32 07/25/2015   HGBA1C 10.8* 07/25/2015   MICROALBUR 1.5 07/25/2015    No results found.  Assessment & Plan:   Problem List Items Addressed This Visit    Diabetes mellitus type 2, uncontrolled, with complications ([place])    Metformin not tolerated.  Following a low GI diet.  Adding glipizide 2.5 mg bid.  Continue exercising. Advised to increase dose to 5 mg bid if BS are not < 140      Relevant Medications   glipiZIDE (GLUCOTROL) 5 MG tablet   Hyperlipidemia due to type 2 diabetes mellitus ([place])    Hd did not tolerate atorvastatin secondary to rash and nausea.  Lab Results  Component Value Date   CHOL 214* 07/25/2015   HDL 38.10* 07/25/2015   LDLCALC 157* 07/25/2015   LDLDIRECT 159.0 07/25/2015   TRIG 97.0 07/25/2015   CHOLHDL 6 07/25/2015         Relevant Medications   glipiZIDE (GLUCOTROL) 5 MG tablet   Diabetic neuropathy, type II diabetes mellitus ([place])    Foot exam is normal       Relevant Medications   glipiZIDE (GLUCOTROL) 5 MG tablet   Statin intolerance   Influenza    Diagnosed feb 14th        Other Visit Diagnoses    Diabetes mellitus  without complication ([place])    -  Primary    Relevant Medications    glipiZIDE (GLUCOTROL) 5 MG tablet    Other Relevant Orders    Lipid panel    Hemoglobin A1c    Comprehensive metabolic panel    LDL cholesterol, direct       I have discontinued Mr. Chess amoxicillin-clavulanate, metFORMIN, and atorvastatin. I am also having him start on glipiZIDE. Additionally, I am having him maintain his vitamin C, folic acid, aspirin EC, glucose blood, and oseltamivir.  Meds ordered this encounter  Medications  . glipiZIDE (GLUCOTROL) 5 MG tablet    Sig: Take 0.5 tablets (2.5 mg total) by mouth 2 (two) times daily before a meal.    Dispense:  60 tablet    Refill:  3   A total of 25 minutes  of face to face time was spent with patient more than half of which was spent in counselling about the above mentioned conditions  and coordination of care   Medications Discontinued During This Encounter  Medication Reason  . amoxicillin-clavulanate (AUGMENTIN) 875-125 MG per tablet Completed Course  . metFORMIN (GLUCOPHAGE-XR) 500 MG 24 hr tablet   . atorvastatin (LIPITOR) 20 MG tablet   . amoxicillin-clavulanate (AUGMENTIN) 875-125 MG per tablet Completed Course  . atorvastatin (LIPITOR) 20 MG tablet   . metFORMIN (GLUCOPHAGE-XR) 500 MG 24 hr tablet     Follow-up: Return in about 3 months (around 11/24/2015) for fastign labs after April 13 .   Crecencio Mc, MD

## 2015-08-28 NOTE — Assessment & Plan Note (Signed)
Diagnosed feb 14th

## 2015-08-30 DIAGNOSIS — E1140 Type 2 diabetes mellitus with diabetic neuropathy, unspecified: Secondary | ICD-10-CM | POA: Insufficient documentation

## 2015-08-30 DIAGNOSIS — Z789 Other specified health status: Secondary | ICD-10-CM | POA: Insufficient documentation

## 2015-08-30 NOTE — Assessment & Plan Note (Addendum)
Hd did not tolerate atorvastatin secondary to rash and nausea.  Lab Results  Component Value Date   CHOL 214* 07/25/2015   HDL 38.10* 07/25/2015   LDLCALC 157* 07/25/2015   LDLDIRECT 159.0 07/25/2015   TRIG 97.0 07/25/2015   CHOLHDL 6 07/25/2015

## 2015-08-30 NOTE — Assessment & Plan Note (Signed)
Foot exam is normal

## 2015-08-30 NOTE — Assessment & Plan Note (Addendum)
Metformin not tolerated.  Following a low GI diet.  Adding glipizide 2.5 mg bid.  Continue exercising. Advised to increase dose to 5 mg bid if BS are not < 140

## 2015-09-15 ENCOUNTER — Telehealth: Payer: Self-pay | Admitting: Internal Medicine

## 2015-09-15 MED ORDER — GLUCOSE BLOOD VI STRP: ORAL_STRIP | Status: DC

## 2015-09-15 NOTE — Telephone Encounter (Signed)
Pt would like his glucose blood test strip to be filled at [facility] in [place] off of [address]. Call t @ [phone]. Thank you!

## 2015-09-16 NOTE — Telephone Encounter (Signed)
Test strips refilled

## 2015-10-20 ENCOUNTER — Telehealth: Payer: Self-pay | Admitting: Internal Medicine

## 2015-10-20 MED ORDER — CETIRIZINE HCL 10 MG PO TABS: 10.0000 mg | ORAL_TABLET | Freq: Every day | ORAL | Status: DC

## 2015-10-20 MED ORDER — GLUCOSE BLOOD VI STRP: ORAL_STRIP | Status: DC

## 2015-10-20 NOTE — Telephone Encounter (Signed)
rx printed

## 2015-10-20 NOTE — Telephone Encounter (Signed)
Pt called to get a refill for glucose blood test strip he test his sugar twice a day and need refill for Zertec for his allergies. Pt states he needs to get a prescription so he can pay with his flex card. Pharmacy is [facility] [place], [place]. Call pt @ [phone]. Thank you!

## 2015-10-20 NOTE — Telephone Encounter (Signed)
Refill on Blood glucose strips completed.  Please advise on prescription for Zytrec.  Thanks

## 2015-10-20 NOTE — Addendum Note (Signed)
Addended by: Crecencio Mc on: 10/20/2015 03:46 PM   Modules accepted: Orders

## 2015-10-23 ENCOUNTER — Other Ambulatory Visit (INDEPENDENT_AMBULATORY_CARE_PROVIDER_SITE_OTHER): Payer: BLUE CROSS/BLUE SHIELD

## 2015-10-23 DIAGNOSIS — E119 Type 2 diabetes mellitus without complications: Secondary | ICD-10-CM | POA: Diagnosis not present

## 2015-10-23 LAB — COMPREHENSIVE METABOLIC PANEL
ALBUMIN: 4 g/dL (ref 3.5–5.2)
ALT: 34 U/L (ref 0–53)
AST: 23 U/L (ref 0–37)
Alkaline Phosphatase: 87 U/L (ref 39–117)
BUN: 19 mg/dL (ref 6–23)
CALCIUM: 9.5 mg/dL (ref 8.4–10.5)
CHLORIDE: 103 meq/L (ref 96–112)
CO2: 34 meq/L — AB (ref 19–32)
CREATININE: 1.02 mg/dL (ref 0.40–1.50)
GFR: 80.13 mL/min (ref >60.00)
Glucose, Bld: 111 mg/dL — ABNORMAL HIGH (ref 70–99)
Potassium: 4.4 mEq/L (ref 3.5–5.1)
SODIUM: 141 meq/L (ref 135–145)
TOTAL PROTEIN: 7.1 g/dL (ref 6.0–8.3)
Total Bilirubin: 0.6 mg/dL (ref 0.2–1.2)

## 2015-10-23 LAB — LIPID PANEL
CHOLESTEROL: 239 mg/dL — AB (ref 0–200)
HDL: 44.5 mg/dL (ref >39.00)
LDL Cholesterol: 178 mg/dL — ABNORMAL HIGH (ref 0–99)
NonHDL: 194.21
TRIGLYCERIDES: 80 mg/dL (ref 0.0–149.0)
Total CHOL/HDL Ratio: 5
VLDL: 16 mg/dL (ref 0.0–40.0)

## 2015-10-23 LAB — LDL CHOLESTEROL, DIRECT: Direct LDL: 157 mg/dL

## 2015-10-23 LAB — HEMOGLOBIN A1C: Hgb A1c MFr Bld: 8.1 % — ABNORMAL HIGH (ref 4.6–6.5)

## 2015-10-26 ENCOUNTER — Encounter: Payer: Self-pay | Admitting: Internal Medicine

## 2015-11-11 NOTE — Telephone Encounter (Signed)
Mailed unread message to patient.  

## 2015-11-26 ENCOUNTER — Encounter: Payer: Self-pay | Admitting: Internal Medicine

## 2015-11-26 ENCOUNTER — Ambulatory Visit (INDEPENDENT_AMBULATORY_CARE_PROVIDER_SITE_OTHER): Payer: BLUE CROSS/BLUE SHIELD | Admitting: Internal Medicine

## 2015-11-26 VITALS — BP 154/90 | HR 61 | Temp 98.4°F | Resp 12 | Ht 67.0 in | Wt 159.0 lb

## 2015-11-26 DIAGNOSIS — E1165 Type 2 diabetes mellitus with hyperglycemia: Secondary | ICD-10-CM

## 2015-11-26 DIAGNOSIS — E118 Type 2 diabetes mellitus with unspecified complications: Secondary | ICD-10-CM

## 2015-11-26 DIAGNOSIS — E785 Hyperlipidemia, unspecified: Secondary | ICD-10-CM

## 2015-11-26 DIAGNOSIS — E1140 Type 2 diabetes mellitus with diabetic neuropathy, unspecified: Secondary | ICD-10-CM

## 2015-11-26 DIAGNOSIS — IMO0002 Reserved for concepts with insufficient information to code with codable children: Secondary | ICD-10-CM

## 2015-11-26 MED ORDER — CETIRIZINE HCL 10 MG PO CAPS: ORAL_CAPSULE | ORAL | Status: DC

## 2015-11-26 NOTE — Progress Notes (Signed)
Subjective:  Patient ID: George Gordon, male    DOB: 05/02/1961  Age: 55 y.o. MRN: [id number]  CC: The primary encounter diagnosis was Uncontrolled type 2 diabetes mellitus with complication, without long-term current use of insulin (George Gordon). Diagnoses of Hyperlipidemia and Type 2 diabetes mellitus with diabetic neuropathy, without long-term current use of insulin (George Gordon) were also pertinent to this visit.  HPI George Gordon presents for 3 month follow up on uncontrolled diabetes. Last a1c was 8.1  In April  and he was asked to check fasting  sugars daily.  He has brought is BS log.  Patient has no complaints today.  Patient is following a low glycemic index diet and taking all prescribed medications regularly without side effects.  Fasting sugars have been under less than 140 most of the time and  He is playing soccer three times weekl for exercise.  Patient has had an eye exam in the last 12 months and checks feet regularly for signs of infection.  Patient does not walk barefoot outside, and does not shower barefoot in public showers.  He continues to have mild tingling or burning in feet. Patient is up to date on all recommended vaccinations.  Lab Results  Component Value Date   HGBA1C 8.1* 10/23/2015   Lab Results  Component Value Date   MICROALBUR 1.5 07/25/2015     Outpatient Prescriptions Prior to Visit  Medication Sig Dispense Refill  . aspirin EC 81 MG tablet Take 1 tablet (81 mg total) by mouth daily. 90 tablet 3  . folic acid (FOLVITE) A999333 MCG tablet Take 400 mcg by mouth daily. Reported on 07/25/2015    . glipiZIDE (GLUCOTROL) 5 MG tablet Take 0.5 tablets (2.5 mg total) by mouth 2 (two) times daily before a meal. 60 tablet 3  . glucose blood test strip Use as instructed to check Blood Glucose twice a day. E11.8 100 each 12  . oseltamivir (TAMIFLU) 75 MG capsule Take 1 capsule (75 mg total) by mouth 2 (two) times daily. 10 capsule 0  . vitamin C (ASCORBIC ACID) 500 MG tablet Take  500 mg by mouth daily. Reported on 07/25/2015    . cetirizine (ZYRTEC) 10 MG tablet Take 1 tablet (10 mg total) by mouth daily. 30 tablet 11   No facility-administered medications prior to visit.    Review of Systems;  Patient denies headache, fevers, malaise, unintentional weight loss, skin rash, eye pain, sinus congestion and sinus pain, sore throat, dysphagia,  hemoptysis , cough, dyspnea, wheezing, chest pain, palpitations, orthopnea, edema, abdominal pain, nausea, melena, diarrhea, constipation, flank pain, dysuria, hematuria, urinary  Frequency, nocturia, numbness, tingling, seizures,  Focal weakness, Loss of consciousness,  Tremor, insomnia, depression, anxiety, and suicidal ideation.      Objective:  BP 154/90 mmHg  Pulse 61  Temp(Src) 98.4 F (36.9 C) (Oral)  Resp 12  Ht 5\' 7"  (1.702 m)  Wt 159 lb (72.122 kg)  BMI 24.90 kg/m2  SpO2 97%  BP Readings from Last 3 Encounters:  11/26/15 154/90  08/27/15 140/80  08/26/15 142/88    Wt Readings from Last 3 Encounters:  11/26/15 159 lb (72.122 kg)  08/27/15 148 lb 8 oz (67.359 kg)  08/26/15 154 lb (69.854 kg)    General appearance: alert, cooperative and appears stated age Ears: normal TM's and external ear canals both ears Throat: lips, mucosa, and tongue normal; teeth and gums normal Neck: no adenopathy, no carotid bruit, supple, symmetrical, trachea midline and thyroid not enlarged, symmetric, no tenderness/mass/nodules  Back: symmetric, no curvature. ROM normal. No CVA tenderness. Lungs: clear to auscultation bilaterally Heart: regular rate and rhythm, S1, S2 normal, no murmur, click, rub or gallop Abdomen: soft, non-tender; bowel sounds normal; no masses,  no organomegaly Pulses: 2+ and symmetric Skin: Skin color, texture, turgor normal. No rashes or lesions Lymph nodes: Cervical, supraclavicular, and axillary nodes normal.  Lab Results  Component Value Date   HGBA1C 8.1* 10/23/2015   HGBA1C 10.8* 07/25/2015    HGBA1C 12.2* 08/16/2014    Lab Results  Component Value Date   CREATININE 1.02 10/23/2015   CREATININE 1.01 07/25/2015   CREATININE 1.06 08/16/2014    Lab Results  Component Value Date   WBC 4.8 08/16/2014   HGB 16.2 08/16/2014   HCT 47.4 08/16/2014   PLT 153.0 08/16/2014   GLUCOSE 111* 10/23/2015   CHOL 239* 10/23/2015   TRIG 80.0 10/23/2015   HDL 44.50 10/23/2015   LDLDIRECT 157.0 10/23/2015   LDLCALC 178* 10/23/2015   ALT 34 10/23/2015   AST 23 10/23/2015   NA 141 10/23/2015   K 4.4 10/23/2015   CL 103 10/23/2015   CREATININE 1.02 10/23/2015   BUN 19 10/23/2015   CO2 34* 10/23/2015   HGBA1C 8.1* 10/23/2015   MICROALBUR 1.5 07/25/2015    No results found.  Assessment & Plan:   Problem List Items Addressed This Visit    Diabetes mellitus type 2, uncontrolled, with complications (George Gordon) - Primary    Fasting sugars are < 140 on current regimen of glipizide 5 mg bid .  He has been asked to che 2 hour post prandials daily and submitereadings. Taking asa 81 mg,  No proteinuria.  Statin intolerance   Lab Results  Component Value Date   HGBA1C 8.1* 10/23/2015   Lab Results  Component Value Date   MICROALBUR 1.5 07/25/2015         Relevant Orders   Comprehensive metabolic panel   Hemoglobin A1c   Microalbumin / creatinine urine ratio   Lipid panel   Diabetic neuropathy, type II diabetes mellitus (George Gordon)    Foot exam is normal despite symptoms of tingling which occur at night        Other Visit Diagnoses    Hyperlipidemia        Relevant Orders    LDL cholesterol, direct       I have discontinued George Gordon cetirizine. I am also having him start on Cetirizine HCl. Additionally, I am having him maintain his vitamin C, folic acid, aspirin EC, oseltamivir, glipiZIDE, and glucose blood.  Meds ordered this encounter  Medications  . Cetirizine HCl (ZYRTEC ALLERGY) 10 MG CAPS    Sig: One tablet daily as needed for seasonal allergies    Dispense:  30 capsule      Refill:  5    Medications Discontinued During This Encounter  Medication Reason  . cetirizine (ZYRTEC) 10 MG tablet     Follow-up: Return for follow up diabetes.   Crecencio Mc, MD

## 2015-11-26 NOTE — Progress Notes (Signed)
Pre-visit discussion using our clinic review tool. No additional management support is needed unless otherwise documented below in the visit note.  

## 2015-11-26 NOTE — Patient Instructions (Addendum)
Your fasting sugars are all excellent.!  You can stop checking them   Please start checking your sugar once daily either 2 hours after lunch or  After dinner , instead of fasting   Use the low glycemic index variety of the KIND bars to prevent low blood sugars between meals.  They taste great and have only 5 g sugars     You are due for labs after July 13   I'll see you in [place]

## 2015-11-27 NOTE — Assessment & Plan Note (Signed)
Foot exam is normal despite symptoms of tingling which occur at night

## 2015-11-27 NOTE — Assessment & Plan Note (Signed)
Fasting sugars are < 140 on current regimen of glipizide 5 mg bid .  He has been asked to che 2 hour post prandials daily and submitereadings. Taking asa 81 mg,  No proteinuria.  Statin intolerance   Lab Results  Component Value Date   HGBA1C 8.1* 10/23/2015   Lab Results  Component Value Date   MICROALBUR 1.5 07/25/2015

## 2016-01-26 ENCOUNTER — Other Ambulatory Visit (INDEPENDENT_AMBULATORY_CARE_PROVIDER_SITE_OTHER): Payer: BLUE CROSS/BLUE SHIELD

## 2016-01-26 DIAGNOSIS — E785 Hyperlipidemia, unspecified: Secondary | ICD-10-CM | POA: Diagnosis not present

## 2016-01-26 DIAGNOSIS — E118 Type 2 diabetes mellitus with unspecified complications: Secondary | ICD-10-CM | POA: Diagnosis not present

## 2016-01-26 DIAGNOSIS — E1165 Type 2 diabetes mellitus with hyperglycemia: Secondary | ICD-10-CM | POA: Diagnosis not present

## 2016-01-26 DIAGNOSIS — IMO0002 Reserved for concepts with insufficient information to code with codable children: Secondary | ICD-10-CM

## 2016-01-26 LAB — COMPREHENSIVE METABOLIC PANEL
ALBUMIN: 4.1 g/dL (ref 3.5–5.2)
ALT: 25 U/L (ref 0–53)
AST: 20 U/L (ref 0–37)
Alkaline Phosphatase: 77 U/L (ref 39–117)
BILIRUBIN TOTAL: 0.8 mg/dL (ref 0.2–1.2)
BUN: 20 mg/dL (ref 6–23)
CALCIUM: 9.6 mg/dL (ref 8.4–10.5)
CHLORIDE: 103 meq/L (ref 96–112)
CO2: 33 meq/L — AB (ref 19–32)
CREATININE: 1.13 mg/dL (ref 0.40–1.50)
GFR: 71.13 mL/min (ref >60.00)
Glucose, Bld: 148 mg/dL — ABNORMAL HIGH (ref 70–99)
Potassium: 4.2 mEq/L (ref 3.5–5.1)
SODIUM: 139 meq/L (ref 135–145)
Total Protein: 7.4 g/dL (ref 6.0–8.3)

## 2016-01-26 LAB — LIPID PANEL
CHOLESTEROL: 235 mg/dL — AB (ref 0–200)
HDL: 42.2 mg/dL (ref >39.00)
LDL CALC: 176 mg/dL — AB (ref 0–99)
NonHDL: 192.69
Total CHOL/HDL Ratio: 6
Triglycerides: 81 mg/dL (ref 0.0–149.0)
VLDL: 16.2 mg/dL (ref 0.0–40.0)

## 2016-01-26 LAB — LDL CHOLESTEROL, DIRECT: LDL DIRECT: 170 mg/dL

## 2016-01-26 LAB — MICROALBUMIN / CREATININE URINE RATIO
Creatinine,U: 270.8 mg/dL
MICROALB/CREAT RATIO: 0.5 mg/g (ref 0.0–30.0)
Microalb, Ur: 1.4 mg/dL (ref 0.0–1.9)

## 2016-01-26 LAB — HEMOGLOBIN A1C: HEMOGLOBIN A1C: 7.6 % — AB (ref 4.6–6.5)

## 2016-01-29 ENCOUNTER — Ambulatory Visit (INDEPENDENT_AMBULATORY_CARE_PROVIDER_SITE_OTHER): Payer: BLUE CROSS/BLUE SHIELD | Admitting: Internal Medicine

## 2016-01-29 ENCOUNTER — Encounter: Payer: Self-pay | Admitting: Internal Medicine

## 2016-01-29 ENCOUNTER — Other Ambulatory Visit: Payer: Self-pay

## 2016-01-29 VITALS — BP 146/88 | HR 72 | Temp 98.3°F | Resp 12 | Ht 67.0 in | Wt 158.5 lb

## 2016-01-29 DIAGNOSIS — E118 Type 2 diabetes mellitus with unspecified complications: Secondary | ICD-10-CM

## 2016-01-29 DIAGNOSIS — E785 Hyperlipidemia, unspecified: Secondary | ICD-10-CM

## 2016-01-29 DIAGNOSIS — E1165 Type 2 diabetes mellitus with hyperglycemia: Secondary | ICD-10-CM | POA: Diagnosis not present

## 2016-01-29 DIAGNOSIS — E1169 Type 2 diabetes mellitus with other specified complication: Secondary | ICD-10-CM | POA: Diagnosis not present

## 2016-01-29 DIAGNOSIS — IMO0002 Reserved for concepts with insufficient information to code with codable children: Secondary | ICD-10-CM

## 2016-01-29 MED ORDER — GLUCOSE BLOOD VI STRP: ORAL_STRIP | Status: DC

## 2016-01-29 MED ORDER — GLIPIZIDE 5 MG PO TABS: ORAL_TABLET | ORAL | Status: DC

## 2016-01-29 NOTE — Telephone Encounter (Signed)
Medication refilled

## 2016-01-29 NOTE — Progress Notes (Signed)
Subjective:  Patient ID: George Gordon, male    DOB: Nov 18, 1960  Age: 55 y.o. MRN: [id number]  CC: The primary encounter diagnosis was Uncontrolled type 2 diabetes mellitus with complication, without long-term current use of insulin ([place]). A diagnosis of Hyperlipidemia due to type 2 diabetes mellitus ([place]) was also pertinent to this visit.  HPI George Gordon presents for FOLLOW UP ON UNCONTROLLED DIABETES.  He has been taking his medications as directed and checking his sugars twice daily.  No lows.  Tolerating medication,  folowing a low GI diet.  FASTIng BLOOD  SUGARS have been ELEVATED  But his post prandial sugars have been in normal range  Taking 2.5  MG GLipizide twice daily , ADVISED TO increase  his evening  dose only.    Elevated BP last 4 occasions. No home readings.  Thinks it is due to stressful work environment   Outpatient Prescriptions Prior to Visit  Medication Sig Dispense Refill  . aspirin EC 81 MG tablet Take 1 tablet (81 mg total) by mouth daily. 90 tablet 3  . Cetirizine HCl (ZYRTEC ALLERGY) 10 MG CAPS One tablet daily as needed for seasonal allergies 30 capsule 5  . folic acid (FOLVITE) A999333 MCG tablet Take 400 mcg by mouth daily. Reported on 07/25/2015    . vitamin C (ASCORBIC ACID) 500 MG tablet Take 500 mg by mouth daily. Reported on 07/25/2015    . glipiZIDE (GLUCOTROL) 5 MG tablet Take 0.5 tablets (2.5 mg total) by mouth 2 (two) times daily before a meal. 60 tablet 3  . glucose blood test strip Use as instructed to check Blood Glucose twice a day. E11.8 100 each 12  . oseltamivir (TAMIFLU) 75 MG capsule Take 1 capsule (75 mg total) by mouth 2 (two) times daily. 10 capsule 0   No facility-administered medications prior to visit.    Review of Systems;  Patient denies headache, fevers, malaise, unintentional weight loss, skin rash, eye pain, sinus congestion and sinus pain, sore throat, dysphagia,  hemoptysis , cough, dyspnea, wheezing, chest pain, palpitations,  orthopnea, edema, abdominal pain, nausea, melena, diarrhea, constipation, flank pain, dysuria, hematuria, urinary  Frequency, nocturia, numbness, tingling, seizures,  Focal weakness, Loss of consciousness,  Tremor, insomnia, depression, anxiety, and suicidal ideation.      Objective:  BP 146/88 mmHg  Pulse 72  Temp(Src) 98.3 F (36.8 C) (Oral)  Resp 12  Ht 5\' 7"  (1.702 m)  Wt 158 lb 8 oz (71.895 kg)  BMI 24.82 kg/m2  SpO2 97%  BP Readings from Last 3 Encounters:  01/29/16 146/88  11/26/15 154/90  08/27/15 140/80    Wt Readings from Last 3 Encounters:  01/29/16 158 lb 8 oz (71.895 kg)  11/26/15 159 lb (72.122 kg)  08/27/15 148 lb 8 oz (67.359 kg)    General appearance: alert, cooperative and appears stated age Ears: normal TM's and external ear canals both ears Throat: lips, mucosa, and tongue normal; teeth and gums normal Neck: no adenopathy, no carotid bruit, supple, symmetrical, trachea midline and thyroid not enlarged, symmetric, no tenderness/mass/nodules Back: symmetric, no curvature. ROM normal. No CVA tenderness. Lungs: clear to auscultation bilaterally Heart: regular rate and rhythm, S1, S2 normal, no murmur, click, rub or gallop Abdomen: soft, non-tender; bowel sounds normal; no masses,  no organomegaly Pulses: 2+ and symmetric Skin: Skin color, texture, turgor normal. No rashes or lesions Lymph nodes: Cervical, supraclavicular, and axillary nodes normal.  Lab Results  Component Value Date   HGBA1C 7.6* 01/26/2016  HGBA1C 8.1* 10/23/2015   HGBA1C 10.8* 07/25/2015    Lab Results  Component Value Date   CREATININE 1.13 01/26/2016   CREATININE 1.02 10/23/2015   CREATININE 1.01 07/25/2015    Lab Results  Component Value Date   WBC 4.8 08/16/2014   HGB 16.2 08/16/2014   HCT 47.4 08/16/2014   PLT 153.0 08/16/2014   GLUCOSE 148* 01/26/2016   CHOL 235* 01/26/2016   TRIG 81.0 01/26/2016   HDL 42.20 01/26/2016   LDLDIRECT 170.0 01/26/2016   LDLCALC 176*  01/26/2016   ALT 25 01/26/2016   AST 20 01/26/2016   NA 139 01/26/2016   K 4.2 01/26/2016   CL 103 01/26/2016   CREATININE 1.13 01/26/2016   BUN 20 01/26/2016   CO2 33* 01/26/2016   HGBA1C 7.6* 01/26/2016   MICROALBUR 1.4 01/26/2016    No results found.  Assessment & Plan:   Problem List Items Addressed This Visit    Diabetes mellitus type 2, uncontrolled, with complications ([place]) - Primary    improved but not at goal.  increase evening dose of glipizide to 5 mg .  Lab Results  Component Value Date   HGBA1C 7.6* 01/26/2016         Relevant Medications   glipiZIDE (GLUCOTROL) 5 MG tablet   Hyperlipidemia due to type 2 diabetes mellitus ([place])    .elevated due to statin intolerance. He did not tolerate atorvastatin secondary to rash and nausea.  Lab Results  Component Value Date   CHOL 235* 01/26/2016   HDL 42.20 01/26/2016   LDLCALC 176* 01/26/2016   LDLDIRECT 170.0 01/26/2016   TRIG 81.0 01/26/2016   CHOLHDL 6 01/26/2016           Relevant Medications   glipiZIDE (GLUCOTROL) 5 MG tablet     A total of 25 minutes of face to face time was spent with patient more than half of which was spent in counselling about the above mentioned conditions  and coordination of care .   I have discontinued Mr. Madren oseltamivir and glucose blood. I have also changed his glipiZIDE. Additionally, I am having him maintain his vitamin C, folic acid, aspirin EC, and Cetirizine HCl.  Meds ordered this encounter  Medications  . DISCONTD: glucose blood test strip    Sig: Use as instructed to check Blood Glucose twice a day. E11.8    Dispense:  100 each    Refill:  12  . glipiZIDE (GLUCOTROL) 5 MG tablet    Sig: Take 0.5 tablet (2.5 mg) in the morning and 1 tablet ( 5 mg) in the evening.    Dispense:  60 tablet    Refill:  3    Medications Discontinued During This Encounter  Medication Reason  . oseltamivir (TAMIFLU) 75 MG capsule Completed Course  . glucose blood test  strip Reorder  . glipiZIDE (GLUCOTROL) 5 MG tablet Reorder    Follow-up: Return in about 3 months (around 04/30/2016) for follow up diabetes labs on or after oct 18.   Crecencio Mc, MD

## 2016-01-29 NOTE — Progress Notes (Signed)
Pre-visit discussion using our clinic review tool. No additional management support is needed unless otherwise documented below in the visit note.  

## 2016-01-29 NOTE — Patient Instructions (Signed)
  Your a1c has improved greatly,   to 7.8,   but we still have work to do to get it < 7.0   Please increase your evening dose of glipizde to 5 mg .  Continue 2.5 mg in the morning.  If you start having low blood sugars (<80) , resume prior dose and let me know  Please have your eye doctor send me a report on your last eye exam  Please check your blood pressure 3 ties ove rthe next 2 weeks and let me know your readings.

## 2016-02-01 NOTE — Assessment & Plan Note (Signed)
.  elevated due to statin intolerance. He did not tolerate atorvastatin secondary to rash and nausea.  Lab Results  Component Value Date   CHOL 235* 01/26/2016   HDL 42.20 01/26/2016   LDLCALC 176* 01/26/2016   LDLDIRECT 170.0 01/26/2016   TRIG 81.0 01/26/2016   CHOLHDL 6 01/26/2016

## 2016-02-01 NOTE — Assessment & Plan Note (Signed)
improved but not at goal.  increase evening dose of glipizide to 5 mg .  Lab Results  Component Value Date   HGBA1C 7.6* 01/26/2016

## 2016-02-16 NOTE — Telephone Encounter (Signed)
Mailed unread message to patient. thanks 

## 2016-05-05 ENCOUNTER — Encounter: Payer: Self-pay | Admitting: Internal Medicine

## 2016-05-05 ENCOUNTER — Ambulatory Visit (INDEPENDENT_AMBULATORY_CARE_PROVIDER_SITE_OTHER): Payer: BLUE CROSS/BLUE SHIELD | Admitting: Internal Medicine

## 2016-05-05 VITALS — BP 150/84 | HR 65 | Temp 97.6°F | Resp 12 | Ht 67.0 in | Wt 161.0 lb

## 2016-05-05 DIAGNOSIS — E118 Type 2 diabetes mellitus with unspecified complications: Secondary | ICD-10-CM

## 2016-05-05 DIAGNOSIS — E1169 Type 2 diabetes mellitus with other specified complication: Secondary | ICD-10-CM

## 2016-05-05 DIAGNOSIS — E785 Hyperlipidemia, unspecified: Secondary | ICD-10-CM | POA: Diagnosis not present

## 2016-05-05 DIAGNOSIS — I10 Essential (primary) hypertension: Secondary | ICD-10-CM | POA: Diagnosis not present

## 2016-05-05 DIAGNOSIS — E1165 Type 2 diabetes mellitus with hyperglycemia: Secondary | ICD-10-CM | POA: Diagnosis not present

## 2016-05-05 DIAGNOSIS — IMO0002 Reserved for concepts with insufficient information to code with codable children: Secondary | ICD-10-CM

## 2016-05-05 DIAGNOSIS — E1140 Type 2 diabetes mellitus with diabetic neuropathy, unspecified: Secondary | ICD-10-CM

## 2016-05-05 LAB — COMPREHENSIVE METABOLIC PANEL
ALBUMIN: 4.1 g/dL (ref 3.5–5.2)
ALK PHOS: 88 U/L (ref 39–117)
ALT: 27 U/L (ref 0–53)
AST: 22 U/L (ref 0–37)
BUN: 17 mg/dL (ref 6–23)
CO2: 33 mEq/L — ABNORMAL HIGH (ref 19–32)
CREATININE: 1.1 mg/dL (ref 0.40–1.50)
Calcium: 9.7 mg/dL (ref 8.4–10.5)
Chloride: 103 mEq/L (ref 96–112)
GFR: 73.3 mL/min (ref >60.00)
GLUCOSE: 127 mg/dL — AB (ref 70–99)
Potassium: 4.6 mEq/L (ref 3.5–5.1)
SODIUM: 139 meq/L (ref 135–145)
TOTAL PROTEIN: 7.5 g/dL (ref 6.0–8.3)
Total Bilirubin: 0.9 mg/dL (ref 0.2–1.2)

## 2016-05-05 LAB — LIPID PANEL
CHOL/HDL RATIO: 5
Cholesterol: 242 mg/dL — ABNORMAL HIGH (ref 0–200)
HDL: 49.5 mg/dL (ref >39.00)
LDL CALC: 174 mg/dL — AB (ref 0–99)
NONHDL: 192.29
TRIGLYCERIDES: 89 mg/dL (ref 0.0–149.0)
VLDL: 17.8 mg/dL (ref 0.0–40.0)

## 2016-05-05 LAB — LDL CHOLESTEROL, DIRECT: Direct LDL: 186 mg/dL

## 2016-05-05 LAB — HEMOGLOBIN A1C: HEMOGLOBIN A1C: 7.2 % — AB (ref 4.6–6.5)

## 2016-05-05 MED ORDER — LISINOPRIL 5 MG PO TABS: 5.0000 mg | ORAL_TABLET | Freq: Every day | ORAL | 0 refills | Status: DC

## 2016-05-05 NOTE — Progress Notes (Signed)
Pre-visit discussion using our clinic review tool. No additional management support is needed unless otherwise documented below in the visit note.  

## 2016-05-05 NOTE — Progress Notes (Signed)
Subjective:  Patient ID: George Gordon, male    DOB: [birth year]  Age: 55 y.o. MRN: [id number]  CC: The primary encounter diagnosis was Uncontrolled type 2 diabetes mellitus with complication, without long-term current use of insulin ([place]). Diagnoses of Hyperlipidemia due to type 2 diabetes mellitus ([place]), Essential hypertension, and Type 2 diabetes mellitus with diabetic neuropathy, without long-term current use of insulin ([place]) were also pertinent to this visit.  HPI George Gordon presents for   diabetes folllow  up .  He feels generally well, is exercising several times per week and checking blood sugars twice daily at variable times.  BS have been under 130 fasting and < 150 post prandially.  Denies any recent hypoglyemic events.  Taking his medications as directed. Following a carbohydrate modified diet 6 days per week. Denies numbness, burning and tingling of extremities. Appetite is good.   Has eliminiated semolina from diet.  B  Getting flu vaccine at school   Has had TDap also in the last 10 years .  BP elevated today.     Lab Results  Component Value Date   HGBA1C 7.2 (H) 05/05/2016      Outpatient Medications Prior to Visit  Medication Sig Dispense Refill  . aspirin EC 81 MG tablet Take 1 tablet (81 mg total) by mouth daily. 90 tablet 3  . Cetirizine HCl (ZYRTEC ALLERGY) 10 MG CAPS One tablet daily as needed for seasonal allergies 30 capsule 5  . folic acid (FOLVITE) A999333 MCG tablet Take 400 mcg by mouth daily. Reported on 07/25/2015    . glipiZIDE (GLUCOTROL) 5 MG tablet Take 0.5 tablet (2.5 mg) in the morning and 1 tablet ( 5 mg) in the evening. 60 tablet 3  . glucose blood test strip Use as instructed to check Blood Glucose twice a day. E11.8 100 each 12  . vitamin C (ASCORBIC ACID) 500 MG tablet Take 500 mg by mouth daily. Reported on 07/25/2015     No facility-administered medications prior to visit.     Review of Systems;  Patient denies headache, fevers,  malaise, unintentional weight loss, skin rash, eye pain, sinus congestion and sinus pain, sore throat, dysphagia,  hemoptysis , cough, dyspnea, wheezing, chest pain, palpitations, orthopnea, edema, abdominal pain, nausea, melena, diarrhea, constipation, flank pain, dysuria, hematuria, urinary  Frequency, nocturia, numbness, tingling, seizures,  Focal weakness, Loss of consciousness,  Tremor, insomnia, depression, anxiety, and suicidal ideation.      Objective:  BP (!) 150/84   Pulse 65   Temp 97.6 F (36.4 C) (Oral)   Resp 12   Ht 5\' 7"  (1.702 m)   Wt 161 lb (73 kg)   SpO2 97%   BMI 25.22 kg/m   BP Readings from Last 3 Encounters:  05/05/16 (!) 150/84  01/29/16 (!) 146/88  11/26/15 (!) 154/90    Wt Readings from Last 3 Encounters:  05/05/16 161 lb (73 kg)  01/29/16 158 lb 8 oz (71.9 kg)  11/26/15 159 lb (72.1 kg)    General appearance: alert, cooperative and appears stated age Ears: normal TM's and external ear canals both ears Throat: lips, mucosa, and tongue normal; teeth and gums normal Neck: no adenopathy, no carotid bruit, supple, symmetrical, trachea midline and thyroid not enlarged, symmetric, no tenderness/mass/nodules Back: symmetric, no curvature. ROM normal. No CVA tenderness. Lungs: clear to auscultation bilaterally Heart: regular rate and rhythm, S1, S2 normal, no murmur, click, rub or gallop Abdomen: soft, non-tender; bowel sounds normal; no masses,  no organomegaly Pulses:  2+ and symmetric Skin: Skin color, texture, turgor normal. No rashes or lesions Lymph nodes: Cervical, supraclavicular, and axillary nodes normal.  Lab Results  Component Value Date   HGBA1C 7.2 (H) 05/05/2016   HGBA1C 7.6 (H) 01/26/2016   HGBA1C 8.1 (H) 10/23/2015    Lab Results  Component Value Date   CREATININE 1.10 05/05/2016   CREATININE 1.13 01/26/2016   CREATININE 1.02 10/23/2015    Lab Results  Component Value Date   WBC 4.8 08/16/2014   HGB 16.2 08/16/2014   HCT 47.4  08/16/2014   PLT 153.0 08/16/2014   GLUCOSE 127 (H) 05/05/2016   CHOL 242 (H) 05/05/2016   TRIG 89.0 05/05/2016   HDL 49.50 05/05/2016   LDLDIRECT 186.0 05/05/2016   LDLCALC 174 (H) 05/05/2016   ALT 27 05/05/2016   AST 22 05/05/2016   NA 139 05/05/2016   K 4.6 05/05/2016   CL 103 05/05/2016   CREATININE 1.10 05/05/2016   BUN 17 05/05/2016   CO2 33 (H) 05/05/2016   HGBA1C 7.2 (H) 05/05/2016   MICROALBUR 1.4 01/26/2016    No results found.  Assessment & Plan:   Problem List Items Addressed This Visit    Diabetes mellitus type 2, uncontrolled, with complications ([place]) - Primary    Improving control with low GI diet, glipizide  and exercise .  hemoglobin A1c is now 7.2  Advised to adjust glipzide dose.  . Patient is referred for  eye exam and foot exam was done today.  There is  no proteinuria on prior micro urinalysis .  Fasting lipids will be repeated  and statin therapy advised if indicated by application of new ACC guidelines based on patient's 10 year risk of CAD   Lab Results  Component Value Date   HGBA1C 7.2 (H) 05/05/2016   Lab Results  Component Value Date   MICROALBUR 1.4 01/26/2016   Lab Results  Component Value Date   CHOL 242 (H) 05/05/2016   HDL 49.50 05/05/2016   LDLCALC 174 (H) 05/05/2016   LDLDIRECT 186.0 05/05/2016   TRIG 89.0 05/05/2016   CHOLHDL 5 05/05/2016          Relevant Medications   lisinopril (PRINIVIL,ZESTRIL) 5 MG tablet   Other Relevant Orders   Comprehensive metabolic panel (Completed)   Hemoglobin A1c (Completed)   Lipid panel (Completed)   Hyperlipidemia due to type 2 diabetes mellitus ([place])    .elevated due to statin intolerance. He did not tolerate atorvastatin secondary to rash and nausea.  Lab Results  Component Value Date   CHOL 242 (H) 05/05/2016   HDL 49.50 05/05/2016   LDLCALC 174 (H) 05/05/2016   LDLDIRECT 186.0 05/05/2016   TRIG 89.0 05/05/2016   CHOLHDL 5 05/05/2016           Relevant Medications    lisinopril (PRINIVIL,ZESTRIL) 5 MG tablet   Other Relevant Orders   LDL cholesterol, direct (Completed)   Diabetic neuropathy, type II diabetes mellitus ([place])    Foot exam done .  No allouses       Relevant Medications   lisinopril (PRINIVIL,ZESTRIL) 5 MG tablet   Essential hypertension    Adding lisinopril today. He will follow up at [facility] for one week BMET and BP check       Relevant Medications   lisinopril (PRINIVIL,ZESTRIL) 5 MG tablet   Other Relevant Orders   Basic metabolic panel    Other Visit Diagnoses   None.     I am having Mr. Bauermeister  start on lisinopril. I am also having him maintain his vitamin C, folic acid, aspirin EC, Cetirizine HCl, glipiZIDE, and glucose blood.  Meds ordered this encounter  Medications  . lisinopril (PRINIVIL,ZESTRIL) 5 MG tablet    Sig: Take 1 tablet (5 mg total) by mouth daily.    Dispense:  90 tablet    Refill:  0    There are no discontinued medications.  Follow-up: Return in about 3 months (around 08/05/2016) for follow up diabetes.   Crecencio Mc, MD

## 2016-05-05 NOTE — Patient Instructions (Addendum)
You can add back semolina to your diet.  Continue 5 mg glipizide at night if having semolina with dinner.  Otherwise 2.5 mg dose at  [facility] morning glipzide 2.5 mg dose  if exercising  That morning,  or if fasting  was < 120   Follow this for 2 weeks so we can observe the pattern   Check with school about Tetanus or TdaP (tetanus plus pertussis ) vaccine (due every 10 years)    I am starting you on lisinopril 5 mg daily for your elevated blood pressure  Please return in one week after you start it for a repeat blood check (brig home machine for BP check)

## 2016-05-07 ENCOUNTER — Telehealth: Payer: Self-pay | Admitting: Internal Medicine

## 2016-05-07 NOTE — Telephone Encounter (Signed)
Pt is cancelling his lab appt for 10/30. He wishes to have his labs done at [facility] clinic. Please advise, thank you!  Call pt @ [phone]

## 2016-05-07 NOTE — Telephone Encounter (Signed)
Was patient to come back for labs also requesting order for BP check at [facility] clinic, I will right up just let me know labs.

## 2016-05-07 NOTE — Telephone Encounter (Signed)
Letter for BMET and BP check to be doen at [facility] printed

## 2016-05-07 NOTE — Telephone Encounter (Signed)
Pt states that he needs in writing what Dr. Derrel Nip would like done. He cancelled both his lab and bp check appt.

## 2016-05-08 DIAGNOSIS — I10 Essential (primary) hypertension: Secondary | ICD-10-CM | POA: Insufficient documentation

## 2016-05-08 NOTE — Assessment & Plan Note (Signed)
Adding lisinopril today. He will follow up at [facility] for one week BMET and BP check

## 2016-05-08 NOTE — Assessment & Plan Note (Signed)
Foot exam done .  No allouses

## 2016-05-08 NOTE — Assessment & Plan Note (Signed)
.  elevated due to statin intolerance. He did not tolerate atorvastatin secondary to rash and nausea.  Lab Results  Component Value Date   CHOL 242 (H) 05/05/2016   HDL 49.50 05/05/2016   LDLCALC 174 (H) 05/05/2016   LDLDIRECT 186.0 05/05/2016   TRIG 89.0 05/05/2016   CHOLHDL 5 05/05/2016

## 2016-05-08 NOTE — Assessment & Plan Note (Addendum)
Improving control with low GI diet, glipizide  and exercise .  hemoglobin A1c is now 7.2  Advised to adjust glipzide dose.  . Patient is referred for  eye exam and foot exam was done today.  There is  no proteinuria on prior micro urinalysis .  Fasting lipids will be repeated  and statin therapy advised if indicated by application of new ACC guidelines based on patient's 10 year risk of CAD   Lab Results  Component Value Date   HGBA1C 7.2 (H) 05/05/2016   Lab Results  Component Value Date   MICROALBUR 1.4 01/26/2016   Lab Results  Component Value Date   CHOL 242 (H) 05/05/2016   HDL 49.50 05/05/2016   LDLCALC 174 (H) 05/05/2016   LDLDIRECT 186.0 05/05/2016   TRIG 89.0 05/05/2016   CHOLHDL 5 05/05/2016

## 2016-05-10 ENCOUNTER — Other Ambulatory Visit: Payer: BLUE CROSS/BLUE SHIELD

## 2016-05-11 NOTE — Telephone Encounter (Signed)
[place] and Staff fax [phone].  Please call patient when faxed.  Call pt @ [phone]

## 2016-05-11 NOTE — Telephone Encounter (Signed)
Reprinted letter for signature.

## 2016-05-12 NOTE — Telephone Encounter (Signed)
Letter signed and faxed to [facility]. Patient notif, ied.

## 2016-05-14 LAB — BASIC METABOLIC PANEL
BUN: 20 mg/dL (ref 4–21)
CREATININE: 1.2 mg/dL (ref 0.6–1.3)
GLUCOSE: 126 mg/dL
POTASSIUM: 4.2 mmol/L (ref 3.4–5.3)
Sodium: 141 mmol/L (ref 137–147)

## 2016-05-16 ENCOUNTER — Telehealth: Payer: Self-pay | Admitting: Internal Medicine

## 2016-05-16 NOTE — Telephone Encounter (Signed)
Repeat labs are normal. Continue current medications,  Report BP when available from [facility]

## 2016-05-17 ENCOUNTER — Encounter: Payer: Self-pay | Admitting: *Deleted

## 2016-05-17 NOTE — Telephone Encounter (Signed)
Letter mailed

## 2016-05-19 ENCOUNTER — Encounter: Payer: Self-pay | Admitting: Internal Medicine

## 2016-05-22 ENCOUNTER — Ambulatory Visit (HOSPITAL_COMMUNITY)
Admission: EM | Admit: 2016-05-22 | Discharge: 2016-05-22 | Disposition: A | Discharge status: Home / Self Care | Payer: BLUE CROSS/BLUE SHIELD | Attending: Family Medicine | Admitting: Family Medicine

## 2016-05-22 ENCOUNTER — Encounter (HOSPITAL_COMMUNITY): Payer: Self-pay | Admitting: Emergency Medicine

## 2016-05-22 DIAGNOSIS — K625 Hemorrhage of anus and rectum: Secondary | ICD-10-CM | POA: Diagnosis not present

## 2016-05-22 MED ORDER — OMEPRAZOLE 20 MG PO CPDR: 20.0000 mg | DELAYED_RELEASE_CAPSULE | Freq: Every day | ORAL | 1 refills | Status: DC

## 2016-05-22 NOTE — ED Triage Notes (Signed)
Noticed this morning.  Reports toilet bowl full of blood.  Denies constipation this morning.  Normal, soft stool.  This is the first time for having this issue.  Reports abdomen was grumbling and having a lot of gas.  Yesterday ate yogurt and apple pie.  Before bed had a cheeseburger.  Denies urinary issues

## 2016-05-22 NOTE — ED Provider Notes (Signed)
[facility]    CSN: [id number] Arrival date & time: 05/22/16  1223     History   Chief Complaint Chief Complaint  Patient presents with  . Rectal Bleeding    HPI George Gordon is a 55 y.o. male.   This a 55 year old male who describes passing bright red blood with a bowel movement this morning. He's had a colonoscopy about 7 years ago but there were no unusual findings at that time. He's ever had any pain with his bowel movements. He's not had a history of hemorrhoids, nor has he had problems with bleeding before.   patient denies any abdominal pain at this point.  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      Past Medical History:  Diagnosis Date  . Diabetes mellitus without complication ([place])   . Hyperlipidemia     Patient Active Problem List   Diagnosis Date Noted  . Essential hypertension 05/08/2016  . Diabetic neuropathy, type II diabetes mellitus ([place]) 08/30/2015  . Statin intolerance 08/30/2015  . Hyperlipidemia due to type 2 diabetes mellitus ([place]) 07/27/2015  . Diabetes mellitus type 2, uncontrolled, with complications ([place]) [id number]    History reviewed. No pertinent surgical history.     Home Medications    Prior to Admission medications   Medication Sig Start Date End Date Taking? Authorizing Provider  folic acid (FOLVITE) A999333 MCG tablet Take 400 mcg by mouth daily. Reported on 07/25/2015   Yes Historical Provider, MD  glipiZIDE (GLUCOTROL) 5 MG tablet Take 0.5 tablet (2.5 mg) in the morning and 1 tablet ( 5 mg) in the evening. 01/29/16  Yes Crecencio Mc, MD  Cetirizine HCl (ZYRTEC ALLERGY) 10 MG CAPS One tablet daily as needed for seasonal allergies 11/26/15   Crecencio Mc, MD  glucose blood test strip Use as instructed to check Blood Glucose twice a day. E11.8 01/29/16   Crecencio Mc, MD  lisinopril (PRINIVIL,ZESTRIL) 5 MG tablet Take 1 tablet (5 mg total) by mouth daily. Patient not  taking: Reported on 05/22/2016 05/05/16   Crecencio Mc, MD  omeprazole (PRILOSEC) 20 MG capsule Take 1 capsule (20 mg total) by mouth daily. 05/22/16   Robyn Haber, MD  vitamin C (ASCORBIC ACID) 500 MG tablet Take 500 mg by mouth daily. Reported on 07/25/2015    Historical Provider, MD    Family History Family History  Problem Relation Age of Onset  . Diabetes Brother     Social History Social History  Substance Use Topics  . Smoking status: Never Smoker  . Smokeless tobacco: Never Used  . Alcohol use 0.0 oz/week     Comment: Rarely     Allergies   Eggs or egg-derived products and Atorvastatin   Review of Systems Review of Systems  Constitutional: Negative.   HENT: Negative.   Respiratory: Negative.   Cardiovascular: Negative.   Gastrointestinal: Positive for blood in stool. Negative for abdominal pain, anal bleeding, diarrhea, nausea, rectal pain and vomiting.  Genitourinary: Negative.   Neurological: Negative for light-headedness.     Physical Exam Triage Vital Signs ED Triage Vitals [05/22/16 1323]  Enc Vitals Group     BP 167/84     Pulse Rate 62     Resp 16     Temp 98.1 F (36.7 C)     Temp Source Oral     SpO2 99 %     Weight      Height  Head Circumference      Peak Flow      Pain Score      Pain Loc      Pain Edu?      Excl. in [place]?    No data found.   Updated Vital Signs BP 167/84 (BP Location: Left Arm)   Pulse 62   Temp 98.1 F (36.7 C) (Oral)   Resp 16   SpO2 99%    Physical Exam  Constitutional: He is oriented to person, place, and time. He appears well-developed and well-nourished.  HENT:  Head: Normocephalic.  Eyes: EOM are normal. Pupils are equal, round, and reactive to light.  Neck: Normal range of motion. Neck supple.  Cardiovascular: Normal rate.   Abdominal: Soft.  Genitourinary: Rectum normal.  Genitourinary Comments: I examined the anal verge and there was no fissure seen. There are no hemorrhoids either.    Musculoskeletal: Normal range of motion.  Neurological: He is alert and oriented to person, place, and time.  Skin: Skin is warm and dry.  Nursing note and vitals reviewed.    UC Treatments / Results  Labs (all labs ordered are listed, but only abnormal results are displayed) Labs Reviewed - No data to display  EKG  EKG Interpretation None       Radiology No results found.  Procedures Procedures (including critical care time)  Medications Ordered in UC Medications - No data to display   Initial Impression / Assessment and Plan / UC Course  I have reviewed the triage vital signs and the nursing notes.  Pertinent labs & imaging results that were available during my care of the patient were reviewed by me and considered in my medical decision making (see chart for details).  Clinical Course     Final Clinical Impressions(s) / UC Diagnoses   Final diagnoses:  Rectal bleeding    New Prescriptions New Prescriptions   OMEPRAZOLE (PRILOSEC) 20 MG CAPSULE    Take 1 capsule (20 mg total) by mouth daily.     Robyn Haber, MD 05/22/16 1343

## 2016-05-26 ENCOUNTER — Telehealth: Payer: Self-pay | Admitting: *Deleted

## 2016-05-26 NOTE — Telephone Encounter (Signed)
06/07/16   At  5 PM the earliest I have.

## 2016-05-26 NOTE — Telephone Encounter (Signed)
Pt has been scheduled, his ER visited is documented in chart.

## 2016-05-26 NOTE — Telephone Encounter (Signed)
Pt will need a follow up from a Urgent care visit, he was advised to follow up up with PCP.  Please give time and date to place pt. Pt contact [phone]

## 2016-06-02 ENCOUNTER — Encounter: Payer: Self-pay | Admitting: Internal Medicine

## 2016-06-02 ENCOUNTER — Ambulatory Visit (INDEPENDENT_AMBULATORY_CARE_PROVIDER_SITE_OTHER): Payer: BLUE CROSS/BLUE SHIELD | Admitting: Internal Medicine

## 2016-06-02 DIAGNOSIS — I10 Essential (primary) hypertension: Secondary | ICD-10-CM | POA: Diagnosis not present

## 2016-06-02 DIAGNOSIS — K921 Melena: Secondary | ICD-10-CM

## 2016-06-02 MED ORDER — AMLODIPINE BESYLATE 2.5 MG PO TABS
2.5000 mg | ORAL_TABLET | Freq: Every day | ORAL | 3 refills | Status: DC
Start: 2016-06-02 — End: 2016-08-05

## 2016-06-02 NOTE — Progress Notes (Signed)
Pre visit review using our clinic review tool, if applicable. No additional management support is needed unless otherwise documented below in the visit note. 

## 2016-06-02 NOTE — Progress Notes (Signed)
Subjective:  Patient ID: George Gordon, male    DOB: Aug 16, 1960  Age: 55 y.o. MRN: [id number]  CC: Diagnoses of Hematochezia and Essential hypertension were pertinent to this visit.  HPI George Gordon presents for Urgent Care  follow up for an isolated episode of hematochezia on Nov 11 "the whole bowl was red," could not see the stool  But it was a solid stool..  No pain , no constipation .  Rectal exam was done,  No fissures or hemorrhoids seen,  Patient was prescribed  omeprazole for unclear reasons.   He has an elevated blood pressure: secondary to stopping lisinopril due to papular rash occurred after 5 days of therapy.  Stopped it for 3 days,  resumed it and the rash returned,  Rash again resolved with suspension of medication .  This occurred one week after starting the medication, about 2 -3 weeks ago .   Lab Results  Component Value Date   HGBA1C 7.2 (H) 05/05/2016   .    Outpatient Medications Prior to Visit  Medication Sig Dispense Refill  . Cetirizine HCl (ZYRTEC ALLERGY) 10 MG CAPS One tablet daily as needed for seasonal allergies 30 capsule 5  . folic acid (FOLVITE) A999333 MCG tablet Take 400 mcg by mouth daily. Reported on 07/25/2015    . glipiZIDE (GLUCOTROL) 5 MG tablet Take 0.5 tablet (2.5 mg) in the morning and 1 tablet ( 5 mg) in the evening. 60 tablet 3  . glucose blood test strip Use as instructed to check Blood Glucose twice a day. E11.8 100 each 12  . omeprazole (PRILOSEC) 20 MG capsule Take 1 capsule (20 mg total) by mouth daily. 21 capsule 1  . vitamin C (ASCORBIC ACID) 500 MG tablet Take 500 mg by mouth daily. Reported on 07/25/2015    . lisinopril (PRINIVIL,ZESTRIL) 5 MG tablet Take 1 tablet (5 mg total) by mouth daily. (Patient not taking: Reported on 06/02/2016) 90 tablet 0   No facility-administered medications prior to visit.     Review of Systems;  Patient denies headache, fevers, malaise, unintentional weight loss, skin rash, eye pain, sinus congestion  and sinus pain, sore throat, dysphagia,  hemoptysis , cough, dyspnea, wheezing, chest pain, palpitations, orthopnea, edema, abdominal pain, nausea, melena, diarrhea, constipation, flank pain, dysuria, hematuria, urinary  Frequency, nocturia, numbness, tingling, seizures,  Focal weakness, Loss of consciousness,  Tremor, insomnia, depression, anxiety, and suicidal ideation.      Objective:  BP (!) 146/86 (BP Location: Left Arm, Patient Position: Sitting, Cuff Size: Normal)   Pulse (!) 56   Temp 98.1 F (36.7 C) (Oral)   Resp 16   Wt 161 lb 8 oz (73.3 kg)   SpO2 98%   BMI 25.29 kg/m   BP Readings from Last 3 Encounters:  06/02/16 (!) 146/86  05/22/16 167/84  05/05/16 (!) 150/84    Wt Readings from Last 3 Encounters:  06/02/16 161 lb 8 oz (73.3 kg)  05/05/16 161 lb (73 kg)  01/29/16 158 lb 8 oz (71.9 kg)    General appearance: alert, cooperative and appears stated age Ears: normal TM's and external ear canals both ears Throat: lips, mucosa, and tongue normal; teeth and gums normal Neck: no adenopathy, no carotid bruit, supple, symmetrical, trachea midline and thyroid not enlarged, symmetric, no tenderness/mass/nodules Back: symmetric, no curvature. ROM normal. No CVA tenderness. Lungs: clear to auscultation bilaterally Heart: regular rate and rhythm, S1, S2 normal, no murmur, click, rub or gallop Abdomen: soft, non-tender; bowel sounds normal;  no masses,  no organomegaly Pulses: 2+ and symmetric Skin: Skin color, texture, turgor normal. No rashes or lesions Lymph nodes: Cervical, supraclavicular, and axillary nodes normal.  Lab Results  Component Value Date   HGBA1C 7.2 (H) 05/05/2016   HGBA1C 7.6 (H) 01/26/2016   HGBA1C 8.1 (H) 10/23/2015    Lab Results  Component Value Date   CREATININE 1.2 05/14/2016   CREATININE 1.10 05/05/2016   CREATININE 1.13 01/26/2016    Lab Results  Component Value Date   WBC 4.8 08/16/2014   HGB 16.2 08/16/2014   HCT 47.4 08/16/2014    PLT 153.0 08/16/2014   GLUCOSE 127 (H) 05/05/2016   CHOL 242 (H) 05/05/2016   TRIG 89.0 05/05/2016   HDL 49.50 05/05/2016   LDLDIRECT 186.0 05/05/2016   LDLCALC 174 (H) 05/05/2016   ALT 27 05/05/2016   AST 22 05/05/2016   NA 141 05/14/2016   K 4.2 05/14/2016   CL 103 05/05/2016   CREATININE 1.2 05/14/2016   BUN 20 05/14/2016   CO2 33 (H) 05/05/2016   HGBA1C 7.2 (H) 05/05/2016   MICROALBUR 1.4 01/26/2016    No results found.  Assessment & Plan:   Problem List Items Addressed This Visit    Essential hypertension    Allergy to lisinopril noted.  No proteinuria.  Start amlodipine 2.5 mg daily goal 120/70      Relevant Medications   amLODipine (NORVASC) 2.5 MG tablet   Hematochezia    Occurred in the setting of daily aspirin use, advised to resume aspirin,  Gen Surg referral if recurrent . Last colonoscopy normal 2010        A total of 25 minutes of face to face time was spent with patient more than half of which was spent in counselling about the above mentioned conditions , reviewing ER notes  and coordination of care  I am having Mr. George Gordon start on amLODipine. I am also having him maintain his vitamin C, folic acid, Cetirizine HCl, glipiZIDE, glucose blood, lisinopril, and omeprazole.  Meds ordered this encounter  Medications  . amLODipine (NORVASC) 2.5 MG tablet    Sig: Take 1 tablet (2.5 mg total) by mouth daily.    Dispense:  90 tablet    Refill:  3    There are no discontinued medications.  Follow-up: No Follow-up on file.   George Mc, MD

## 2016-06-02 NOTE — Patient Instructions (Addendum)
For your blood pressure:  We will start amlodipine at  2.5 mg daily  . Goal is 120/70 or less for blood pressure (after one week) .   If your BP is not at goal after 1-2 weeks, increase  The dose to 5 mg daily .    The blood in your stool may be from a polyp in your  colon.   It is not from your stomach,  So you can stop the omeprazole  Resume your aspirin,  If the bleeding returns  We will have your colonoscopy repeated.  You are due for Repeat labs in Early February at [facility]:  A1c CMET Lipid CBC wi diff

## 2016-06-04 DIAGNOSIS — K921 Melena: Secondary | ICD-10-CM | POA: Insufficient documentation

## 2016-06-04 NOTE — Assessment & Plan Note (Signed)
Occurred in the setting of daily aspirin use, advised to resume aspirin,  Gen Surg referral if recurrent . Last colonoscopy normal 2010

## 2016-06-04 NOTE — Assessment & Plan Note (Signed)
Allergy to lisinopril noted.  No proteinuria.  Start amlodipine 2.5 mg daily goal 120/70

## 2016-07-02 ENCOUNTER — Encounter: Payer: Self-pay | Admitting: Emergency Medicine

## 2016-07-02 ENCOUNTER — Emergency Department: Payer: BLUE CROSS/BLUE SHIELD

## 2016-07-02 ENCOUNTER — Emergency Department
Admission: EM | Admit: 2016-07-02 | Discharge: 2016-07-03 | Disposition: A | Discharge status: Home / Self Care | Payer: BLUE CROSS/BLUE SHIELD | Attending: Emergency Medicine | Admitting: Emergency Medicine

## 2016-07-02 DIAGNOSIS — Z7984 Long term (current) use of oral hypoglycemic drugs: Secondary | ICD-10-CM | POA: Diagnosis not present

## 2016-07-02 DIAGNOSIS — S022XXA Fracture of nasal bones, initial encounter for closed fracture: Secondary | ICD-10-CM | POA: Insufficient documentation

## 2016-07-02 DIAGNOSIS — E119 Type 2 diabetes mellitus without complications: Secondary | ICD-10-CM | POA: Insufficient documentation

## 2016-07-02 DIAGNOSIS — Y929 Unspecified place or not applicable: Secondary | ICD-10-CM | POA: Diagnosis not present

## 2016-07-02 DIAGNOSIS — Y999 Unspecified external cause status: Secondary | ICD-10-CM | POA: Insufficient documentation

## 2016-07-02 DIAGNOSIS — Z79899 Other long term (current) drug therapy: Secondary | ICD-10-CM | POA: Insufficient documentation

## 2016-07-02 DIAGNOSIS — S0990XA Unspecified injury of head, initial encounter: Secondary | ICD-10-CM | POA: Diagnosis not present

## 2016-07-02 DIAGNOSIS — I10 Essential (primary) hypertension: Secondary | ICD-10-CM | POA: Diagnosis not present

## 2016-07-02 DIAGNOSIS — S0231XA Fracture of orbital floor, right side, initial encounter for closed fracture: Secondary | ICD-10-CM | POA: Diagnosis not present

## 2016-07-02 DIAGNOSIS — Y9389 Activity, other specified: Secondary | ICD-10-CM | POA: Insufficient documentation

## 2016-07-02 DIAGNOSIS — T7411XA Adult physical abuse, confirmed, initial encounter: Secondary | ICD-10-CM | POA: Diagnosis not present

## 2016-07-02 HISTORY — DX: Essential (primary) hypertension: I10

## 2016-07-02 MED ORDER — TETRACAINE HCL 0.5 % OP SOLN
2.0000 [drp] | Freq: Once | OPHTHALMIC | Status: DC
  Filled 2016-07-02: qty 2

## 2016-07-02 MED ORDER — FLUORESCEIN SODIUM 0.6 MG OP STRP
1.0000 | ORAL_STRIP | Freq: Once | OPHTHALMIC | Status: DC
  Filled 2016-07-02: qty 1
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
## 2016-07-03 MED ORDER — OXYCODONE-ACETAMINOPHEN 5-325 MG PO TABS: 2.0000 | ORAL_TABLET | Freq: Four times a day (QID) | ORAL | 0 refills | Status: DC | PRN

## 2016-07-03 NOTE — ED Notes (Signed)
Pt updated on treatment plan progression. Pt verbalizes understanding.

## 2016-07-08 DIAGNOSIS — S022XXA Fracture of nasal bones, initial encounter for closed fracture: Secondary | ICD-10-CM | POA: Diagnosis not present

## 2016-07-08 DIAGNOSIS — H4311 Vitreous hemorrhage, right eye: Secondary | ICD-10-CM | POA: Diagnosis not present

## 2016-07-08 DIAGNOSIS — S0231XA Fracture of orbital floor, right side, initial encounter for closed fracture: Secondary | ICD-10-CM | POA: Diagnosis not present

## 2016-07-08 DIAGNOSIS — H33311 Horseshoe tear of retina without detachment, right eye: Secondary | ICD-10-CM | POA: Diagnosis not present

## 2016-07-08 DIAGNOSIS — H3563 Retinal hemorrhage, bilateral: Secondary | ICD-10-CM | POA: Diagnosis not present

## 2016-07-19 DIAGNOSIS — H35352 Cystoid macular degeneration, left eye: Secondary | ICD-10-CM | POA: Diagnosis not present

## 2016-07-19 DIAGNOSIS — H35033 Hypertensive retinopathy, bilateral: Secondary | ICD-10-CM | POA: Diagnosis not present

## 2016-07-19 DIAGNOSIS — H4311 Vitreous hemorrhage, right eye: Secondary | ICD-10-CM | POA: Diagnosis not present

## 2016-08-05 ENCOUNTER — Ambulatory Visit (INDEPENDENT_AMBULATORY_CARE_PROVIDER_SITE_OTHER): Payer: BLUE CROSS/BLUE SHIELD | Admitting: Internal Medicine

## 2016-08-05 VITALS — BP 146/82 | HR 72 | Temp 97.8°F | Resp 16 | Ht 68.0 in | Wt 161.1 lb

## 2016-08-05 DIAGNOSIS — I679 Cerebrovascular disease, unspecified: Secondary | ICD-10-CM

## 2016-08-05 DIAGNOSIS — E1165 Type 2 diabetes mellitus with hyperglycemia: Secondary | ICD-10-CM | POA: Diagnosis not present

## 2016-08-05 DIAGNOSIS — IMO0002 Reserved for concepts with insufficient information to code with codable children: Secondary | ICD-10-CM

## 2016-08-05 DIAGNOSIS — I10 Essential (primary) hypertension: Secondary | ICD-10-CM

## 2016-08-05 DIAGNOSIS — E118 Type 2 diabetes mellitus with unspecified complications: Secondary | ICD-10-CM | POA: Diagnosis not present

## 2016-08-05 DIAGNOSIS — E785 Hyperlipidemia, unspecified: Secondary | ICD-10-CM | POA: Diagnosis not present

## 2016-08-05 DIAGNOSIS — E1169 Type 2 diabetes mellitus with other specified complication: Secondary | ICD-10-CM | POA: Diagnosis not present

## 2016-08-05 DIAGNOSIS — S0993XS Unspecified injury of face, sequela: Secondary | ICD-10-CM

## 2016-08-05 DIAGNOSIS — R9089 Other abnormal findings on diagnostic imaging of central nervous system: Secondary | ICD-10-CM | POA: Diagnosis not present

## 2016-08-05 LAB — MICROALBUMIN / CREATININE URINE RATIO
Creatinine,U: 196.5 mg/dL
Microalb Creat Ratio: 0.6 mg/g (ref 0.0–30.0)
Microalb, Ur: 1.2 mg/dL (ref 0.0–1.9)

## 2016-08-05 LAB — LIPID PANEL
Cholesterol: 297 mg/dL — ABNORMAL HIGH (ref 0–200)
HDL: 48.2 mg/dL (ref >39.00)
LDL CALC: 230 mg/dL — AB (ref 0–99)
NonHDL: 248.89
TRIGLYCERIDES: 95 mg/dL (ref 0.0–149.0)
Total CHOL/HDL Ratio: 6
VLDL: 19 mg/dL (ref 0.0–40.0)

## 2016-08-05 LAB — COMPREHENSIVE METABOLIC PANEL
ALK PHOS: 84 U/L (ref 39–117)
ALT: 32 U/L (ref 0–53)
AST: 19 U/L (ref 0–37)
Albumin: 4.3 g/dL (ref 3.5–5.2)
BUN: 20 mg/dL (ref 6–23)
CHLORIDE: 103 meq/L (ref 96–112)
CO2: 31 mEq/L (ref 19–32)
Calcium: 9.8 mg/dL (ref 8.4–10.5)
Creatinine, Ser: 1.05 mg/dL (ref 0.40–1.50)
GFR: 77.27 mL/min (ref >60.00)
GLUCOSE: 147 mg/dL — AB (ref 70–99)
POTASSIUM: 4.3 meq/L (ref 3.5–5.1)
SODIUM: 139 meq/L (ref 135–145)
TOTAL PROTEIN: 7.5 g/dL (ref 6.0–8.3)
Total Bilirubin: 0.9 mg/dL (ref 0.2–1.2)

## 2016-08-05 LAB — HEMOGLOBIN A1C: Hgb A1c MFr Bld: 7.8 % — ABNORMAL HIGH (ref 4.6–6.5)

## 2016-08-05 LAB — LDL CHOLESTEROL, DIRECT: Direct LDL: 214 mg/dL

## 2016-08-05 MED ORDER — ALPRAZOLAM 0.25 MG PO TABS: 0.2500 mg | ORAL_TABLET | Freq: Two times a day (BID) | ORAL | 0 refills | Status: DC | PRN
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
## 2016-08-05 NOTE — Progress Notes (Signed)
Pre-visit discussion using our clinic review tool. No additional management support is needed unless otherwise documented below in the visit note.  

## 2016-08-05 NOTE — Patient Instructions (Signed)
Resume amlodipine,  If rash recurs,  STOP IT AND LET ME KNOW   Recommend walking or stationery bike for exercise  MRI brain to be ordered   Alprazolam can be used for insomnia/panic as needed

## 2016-08-07 ENCOUNTER — Encounter: Payer: Self-pay | Admitting: Internal Medicine

## 2016-08-07 DIAGNOSIS — S0993XS Unspecified injury of face, sequela: Secondary | ICD-10-CM | POA: Insufficient documentation

## 2016-08-07 DIAGNOSIS — I679 Cerebrovascular disease, unspecified: Secondary | ICD-10-CM

## 2016-08-07 DIAGNOSIS — I6930 Unspecified sequelae of cerebral infarction: Secondary | ICD-10-CM | POA: Insufficient documentation

## 2016-08-07 HISTORY — DX: Unspecified injury of face, sequela: S09.93XS

## 2016-08-07 NOTE — Assessment & Plan Note (Signed)
slight loss of  hemoglobin A1c has risen to 7.8. .  Will increase glipizide dose to 5 mg in am and 10 mg in pm  . Patient is referred for  eye exam and foot exam was done today.  There is  no proteinuria  . He reports a rash with lisinopril  Lab Results  Component Value Date   HGBA1C 7.8 (H) 08/05/2016   Lab Results  Component Value Date   MICROALBUR 1.2 08/05/2016   Lab Results  Component Value Date   CHOL 297 (H) 08/05/2016   HDL 48.20 08/05/2016   LDLCALC 230 (H) 08/05/2016   LDLDIRECT 214.0 08/05/2016   TRIG 95.0 08/05/2016   CHOLHDL 6 08/05/2016
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
## 2016-08-07 NOTE — Assessment & Plan Note (Signed)
.  elevated due to statin intolerance. He did not tolerate atorvastatin secondary to rash and nausea.  Lab Results  Component Value Date   CHOL 297 (H) 08/05/2016   HDL 48.20 08/05/2016   LDLCALC 230 (H) 08/05/2016   LDLDIRECT 214.0 08/05/2016   TRIG 95.0 08/05/2016   CHOLHDL 6 08/05/2016
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
## 2016-08-07 NOTE — Assessment & Plan Note (Addendum)
Incidental finding of a lacunar infarct on head CT.  MRI ordered

## 2016-08-07 NOTE — Assessment & Plan Note (Signed)
He developed a rash to amlodipine, but also developed a rash to atorvastatin and lisinopril.  Advised to resume amlodipine an if rash recurs, stop the medication

## 2016-08-13 ENCOUNTER — Ambulatory Visit
Admission: RE | Admit: 2016-08-13 | Discharge: 2016-08-13 | Disposition: A | Discharge status: Home / Self Care | Payer: BLUE CROSS/BLUE SHIELD | Source: Ambulatory Visit | Attending: Internal Medicine | Admitting: Internal Medicine

## 2016-08-13 DIAGNOSIS — H538 Other visual disturbances: Secondary | ICD-10-CM | POA: Diagnosis not present

## 2016-08-13 DIAGNOSIS — R9089 Other abnormal findings on diagnostic imaging of central nervous system: Secondary | ICD-10-CM

## 2016-08-13 DIAGNOSIS — R51 Headache: Secondary | ICD-10-CM | POA: Diagnosis not present

## 2016-08-13 DIAGNOSIS — G9389 Other specified disorders of brain: Secondary | ICD-10-CM | POA: Diagnosis not present

## 2016-08-13 MED ORDER — GADOBENATE DIMEGLUMINE 529 MG/ML IV SOLN
15.0000 mL | Freq: Once | INTRAVENOUS | Status: AC | PRN
  Administered 2016-08-13: 15 mL via INTRAVENOUS

## 2016-09-08 ENCOUNTER — Other Ambulatory Visit: Payer: Self-pay | Admitting: Internal Medicine

## 2016-09-15 ENCOUNTER — Telehealth: Payer: Self-pay | Admitting: Internal Medicine

## 2016-09-15 NOTE — Telephone Encounter (Signed)
Patient came into clinic for rash around neck area, patient had previously discussed with PCP on visit 08/07/16, patient stopped Amlodipine 2.5 mg for one week at which rash resolved and then patient restarted Amlodipine 1 week later and within a week rash was back on patient neck. The rash appeared as small fine papules pin point in size just around neck are. Spoke with PCP and was advised to have patient stop Amlodipine and to check BP in one week. Patient declined pharmacy and is scheduled to come into office for BP check on 09/23/16. Please advised to add order in note for repeat BP check.

## 2016-09-15 NOTE — Telephone Encounter (Signed)
  I have reviewed the above information and agree with above. Patient is instructed to dc amlodipine and return in one week for a RN visit to check blood pressure  Deborra Medina, MD

## 2016-09-27 ENCOUNTER — Ambulatory Visit: Payer: Self-pay | Admitting: *Deleted

## 2016-09-27 VITALS — BP 160/90 | HR 68

## 2016-09-27 DIAGNOSIS — I10 Essential (primary) hypertension: Secondary | ICD-10-CM

## 2016-10-18 ENCOUNTER — Telehealth: Payer: Self-pay | Admitting: *Deleted

## 2016-10-18 ENCOUNTER — Telehealth: Payer: Self-pay | Admitting: Internal Medicine

## 2016-10-18 MED ORDER — LOSARTAN POTASSIUM-HCTZ 50-12.5 MG PO TABS: 1.0000 | ORAL_TABLET | Freq: Every day | ORAL | 3 refills | Status: DC

## 2016-10-18 NOTE — Telephone Encounter (Signed)
Was a blood pressure mediation supposed to be called in. Didn't see one in the pt's chart. Please advise.

## 2016-10-18 NOTE — Telephone Encounter (Signed)
Ok to call in ,  This is an OTC medication and I cannot find the gelcaps in the 100 entreis in epic, so I have no more time to waste on this!

## 2016-10-18 NOTE — Telephone Encounter (Signed)
I don't see that we discussed blood pressure medication in a message,  But I see that a nurse somewhere checked his  Blood pressure and it was elevated.  Where was that done ? The chart says "occupational"  I will call in losartan hctz,  Take once daily in the morning

## 2016-10-18 NOTE — Telephone Encounter (Signed)
Please advise 

## 2016-10-18 NOTE — Telephone Encounter (Signed)
Pt called back returning your call. Informed pt that medication was called in. Pt also was asking about blood pressure medication that was supposed to be called in for him. Please advise, thank you!  Call pt @ [phone]

## 2016-10-18 NOTE — Telephone Encounter (Signed)
LMTCB. Need to let pt know that the rx for gel capsules was called in.

## 2016-10-18 NOTE — Telephone Encounter (Signed)
Pharmacy called and stated that pt dropped off rx for Cetirizine HCl (ZYRTEC ALLERGY) 10 MG CAPS but would like it for the gel caps. Please advise, thank you!  [place] [address], [place]

## 2016-10-18 NOTE — Telephone Encounter (Signed)
Patient requested to have a medication refill for Zyrtec gel tablets Pharmacy [facility] [address]  Pt contact [phone]

## 2016-10-18 NOTE — Telephone Encounter (Signed)
Called and gave pharmacist a verbal for the gel caps.

## 2016-10-19 NOTE — Telephone Encounter (Signed)
Spoke with patient he states blood pressure was check at [facility] .   Advised that you called in blood pressure medication patient verbalized understanding.  He will continue to monitor blood pressure.

## 2016-10-21 DIAGNOSIS — H43813 Vitreous degeneration, bilateral: Secondary | ICD-10-CM | POA: Diagnosis not present

## 2016-10-21 DIAGNOSIS — E113391 Type 2 diabetes mellitus with moderate nonproliferative diabetic retinopathy without macular edema, right eye: Secondary | ICD-10-CM | POA: Diagnosis not present

## 2016-10-21 DIAGNOSIS — E113312 Type 2 diabetes mellitus with moderate nonproliferative diabetic retinopathy with macular edema, left eye: Secondary | ICD-10-CM | POA: Diagnosis not present

## 2016-10-21 DIAGNOSIS — H35033 Hypertensive retinopathy, bilateral: Secondary | ICD-10-CM | POA: Diagnosis not present

## 2016-11-03 ENCOUNTER — Other Ambulatory Visit: Payer: Self-pay | Admitting: Internal Medicine

## 2016-12-02 DIAGNOSIS — H25011 Cortical age-related cataract, right eye: Secondary | ICD-10-CM | POA: Diagnosis not present

## 2016-12-14 DIAGNOSIS — H25011 Cortical age-related cataract, right eye: Secondary | ICD-10-CM | POA: Diagnosis not present

## 2016-12-15 ENCOUNTER — Encounter: Payer: Self-pay | Admitting: *Deleted

## 2016-12-21 ENCOUNTER — Encounter: Payer: Self-pay | Admitting: *Deleted

## 2016-12-21 ENCOUNTER — Ambulatory Visit: Payer: BLUE CROSS/BLUE SHIELD | Admitting: Anesthesiology

## 2016-12-21 ENCOUNTER — Ambulatory Visit
Admission: RE | Admit: 2016-12-21 | Discharge: 2016-12-21 | Disposition: A | Discharge status: Home / Self Care | Payer: BLUE CROSS/BLUE SHIELD | Source: Ambulatory Visit | Attending: Ophthalmology | Admitting: Ophthalmology

## 2016-12-21 ENCOUNTER — Encounter
Admission: RE | Disposition: A | Discharge status: Home / Self Care | Payer: Self-pay | Source: Ambulatory Visit | Attending: Ophthalmology

## 2016-12-21 DIAGNOSIS — E119 Type 2 diabetes mellitus without complications: Secondary | ICD-10-CM | POA: Insufficient documentation

## 2016-12-21 DIAGNOSIS — Z7982 Long term (current) use of aspirin: Secondary | ICD-10-CM | POA: Diagnosis not present

## 2016-12-21 DIAGNOSIS — H2511 Age-related nuclear cataract, right eye: Secondary | ICD-10-CM | POA: Diagnosis not present

## 2016-12-21 DIAGNOSIS — H25011 Cortical age-related cataract, right eye: Secondary | ICD-10-CM | POA: Diagnosis not present

## 2016-12-21 DIAGNOSIS — Z79899 Other long term (current) drug therapy: Secondary | ICD-10-CM | POA: Insufficient documentation

## 2016-12-21 DIAGNOSIS — Z7984 Long term (current) use of oral hypoglycemic drugs: Secondary | ICD-10-CM | POA: Diagnosis not present

## 2016-12-21 DIAGNOSIS — I10 Essential (primary) hypertension: Secondary | ICD-10-CM | POA: Insufficient documentation

## 2016-12-21 HISTORY — PX: CATARACT EXTRACTION W/PHACO: SHX586

## 2016-12-21 LAB — GLUCOSE, CAPILLARY: GLUCOSE-CAPILLARY: 158 mg/dL — AB (ref 65–99)

## 2016-12-21 SURGERY — PHACOEMULSIFICATION, CATARACT, WITH IOL INSERTION
Anesthesia: Monitor Anesthesia Care | Site: Eye | Laterality: Right | Wound class: Clean

## 2016-12-21 MED ORDER — MOXIFLOXACIN HCL 0.5 % OP SOLN
OPHTHALMIC | Status: AC
  Filled 2016-12-21: qty 3

## 2016-12-21 MED ORDER — FENTANYL CITRATE (PF) 100 MCG/2ML IJ SOLN
INTRAMUSCULAR | Status: DC | PRN
  Administered 2016-12-21: 50 ug via INTRAVENOUS

## 2016-12-21 MED ORDER — ARMC OPHTHALMIC DILATING DROPS
1.0000 "application " | OPHTHALMIC | Status: DC
  Administered 2016-12-21 (×3): 1 via OPHTHALMIC

## 2016-12-21 MED ORDER — MIDAZOLAM HCL 2 MG/2ML IJ SOLN
INTRAMUSCULAR | Status: DC | PRN
Start: 2016-12-21 — End: 2016-12-21
  Administered 2016-12-21: 1 mg via INTRAVENOUS

## 2016-12-21 MED ORDER — SODIUM CHLORIDE 0.9 % IV SOLN
INTRAVENOUS | Status: DC
  Administered 2016-12-21: 08:00:00 via INTRAVENOUS

## 2016-12-21 MED ORDER — FENTANYL CITRATE (PF) 100 MCG/2ML IJ SOLN
INTRAMUSCULAR | Status: AC
  Filled 2016-12-21: qty 2

## 2016-12-21 MED ORDER — LIDOCAINE HCL (PF) 2 % IJ SOLN
INTRAMUSCULAR | Status: AC
  Filled 2016-12-21: qty 2

## 2016-12-21 MED ORDER — MOXIFLOXACIN HCL 0.5 % OP SOLN
OPHTHALMIC | Status: DC | PRN
  Administered 2016-12-21: .2 mL via OPHTHALMIC

## 2016-12-21 MED ORDER — MOXIFLOXACIN HCL 0.5 % OP SOLN: 1.0000 [drp] | OPHTHALMIC | Status: DC | PRN

## 2016-12-21 MED ORDER — EPINEPHRINE PF 1 MG/ML IJ SOLN
INTRAMUSCULAR | Status: DC | PRN
  Administered 2016-12-21: 1 mL via OPHTHALMIC

## 2016-12-21 MED ORDER — NA CHONDROIT SULF-NA HYALURON 40-17 MG/ML IO SOLN
INTRAOCULAR | Status: AC
  Filled 2016-12-21: qty 1

## 2016-12-21 MED ORDER — CARBACHOL 0.01 % IO SOLN
INTRAOCULAR | Status: DC | PRN
  Administered 2016-12-21: .5 mL via INTRAOCULAR

## 2016-12-21 MED ORDER — EPINEPHRINE PF 1 MG/ML IJ SOLN
INTRAMUSCULAR | Status: AC
  Filled 2016-12-21: qty 2

## 2016-12-21 MED ORDER — ARMC OPHTHALMIC DILATING DROPS
OPHTHALMIC | Status: AC
  Administered 2016-12-21: 1 via OPHTHALMIC
  Filled 2016-12-21: qty 0.4

## 2016-12-21 MED ORDER — LIDOCAINE HCL (PF) 4 % IJ SOLN
INTRAOCULAR | Status: DC | PRN
  Administered 2016-12-21: 2 mL via OPHTHALMIC

## 2016-12-21 MED ORDER — NA CHONDROIT SULF-NA HYALURON 40-17 MG/ML IO SOLN
INTRAOCULAR | Status: DC | PRN
  Administered 2016-12-21: 1 mL via INTRAOCULAR

## 2016-12-21 MED ORDER — POVIDONE-IODINE 5 % OP SOLN
OPHTHALMIC | Status: AC
  Filled 2016-12-21: qty 30

## 2016-12-21 MED ORDER — GLYCOPYRROLATE 0.2 MG/ML IJ SOLN
INTRAMUSCULAR | Status: DC | PRN
  Administered 2016-12-21: 0.2 mg via INTRAVENOUS

## 2016-12-21 MED ORDER — MIDAZOLAM HCL 2 MG/2ML IJ SOLN
INTRAMUSCULAR | Status: AC
  Filled 2016-12-21: qty 2

## 2016-12-21 MED ORDER — POVIDONE-IODINE 5 % OP SOLN
OPHTHALMIC | Status: DC | PRN
  Administered 2016-12-21: 1 via OPHTHALMIC

## 2016-12-21 SURGICAL SUPPLY — 14 items
GLOVE BIO SURGEON STRL SZ8 (GLOVE) ×2 IMPLANT
GLOVE BIOGEL M 6.5 STRL (GLOVE) ×2 IMPLANT
GLOVE SURG LX 8.0 MICRO (GLOVE) ×1
GLOVE SURG LX STRL 8.0 MICRO (GLOVE) ×1 IMPLANT
GOWN STRL REUS W/ TWL LRG LVL3 (GOWN DISPOSABLE) ×2 IMPLANT
GOWN STRL REUS W/TWL LRG LVL3 (GOWN DISPOSABLE) ×2
LENS IOL TECNIS ITEC 23.0 (Intraocular Lens) ×2 IMPLANT
PACK CATARACT (MISCELLANEOUS) ×2 IMPLANT
PACK CATARACT BRASINGTON LX (MISCELLANEOUS) ×2 IMPLANT
SOL BSS BAG (MISCELLANEOUS) ×2
SOLUTION BSS BAG (MISCELLANEOUS) ×1 IMPLANT
SYR 5ML LL (SYRINGE) ×2 IMPLANT
WATER STERILE IRR 250ML POUR (IV SOLUTION) ×2 IMPLANT
WIPE NON LINTING 3.25X3.25 (MISCELLANEOUS) ×2 IMPLANT

## 2016-12-21 NOTE — Discharge Instructions (Signed)
FOLLOW DR. PORFILIO'S POSTOP DISCHARGE INSTRUCTION SHEET AS REVIEWED.  Eye Surgery Discharge Instructions  Expect mild scratchy sensation or mild soreness. DO NOT RUB YOUR EYE!  The day of surgery:  Minimal physical activity, but bed rest is not required  No reading, computer work, or close hand work  No bending, lifting, or straining.  May watch TV  For 24 hours:  No driving, legal decisions, or alcoholic beverages  Safety precautions  Eat anything you prefer: It is better to start with liquids, then soup then solid foods.  _____ Eye patch should be worn until postoperative exam tomorrow.  ____ Solar shield eyeglasses should be worn for comfort in the sunlight/patch while sleeping  Resume all regular medications including aspirin or Coumadin if these were discontinued prior to surgery. You may shower, bathe, shave, or wash your hair. Tylenol may be taken for mild discomfort.  Call your doctor if you experience significant pain, nausea, or vomiting, fever > 101 or other signs of infection. [phone] or [phone] Specific instructions:  Follow-up Information    Birder Robson, MD Follow up.   Specialty:  Ophthalmology Why:  Wednesday 12/22/16 @ 9:20 am Contact information: [address] [place] [address] [phone]

## 2016-12-21 NOTE — Anesthesia Preprocedure Evaluation (Signed)
Anesthesia Evaluation  Patient identified by MRN, date of birth, ID band Patient awake    Reviewed: Allergy & Precautions, NPO status , Patient's Chart, lab work & pertinent test results  History of Anesthesia Complications Negative for: history of anesthetic complications  Airway Mallampati: II       Dental   Pulmonary neg pulmonary ROS,           Cardiovascular hypertension, Pt. on medications      Neuro/Psych negative neurological ROS     GI/Hepatic negative GI ROS, Neg liver ROS,   Endo/Other  diabetes, Type 2, Oral Hypoglycemic Agents  Renal/GU      Musculoskeletal   Abdominal   Peds  Hematology   Anesthesia Other Findings   Reproductive/Obstetrics                             Anesthesia Physical Anesthesia Plan  ASA: III  Anesthesia Plan: MAC   Post-op Pain Management:    Induction: Intravenous  PONV Risk Score and Plan: 1 and Ondansetron and Dexamethasone  Airway Management Planned:   Additional Equipment:   Intra-op Plan:   Post-operative Plan:   Informed Consent: I have reviewed the patients History and Physical, chart, labs and discussed the procedure including the risks, benefits and alternatives for the proposed anesthesia with the patient or authorized representative who has indicated his/her understanding and acceptance.     Plan Discussed with:   Anesthesia Plan Comments:         Anesthesia Quick Evaluation

## 2016-12-21 NOTE — Transfer of Care (Signed)
Immediate Anesthesia Transfer of Care Note  Patient: George Gordon  Procedure(s) Performed: Procedure(s) with comments: CATARACT EXTRACTION PHACO AND INTRAOCULAR LENS PLACEMENT ([place]) (Right) - [place] 00:27 AP% 11.3 CDE 3.08 fluid pack lot # [id number]  Patient Location: PACU and Short Stay  Anesthesia Type:MAC  Level of Consciousness: awake, alert  and oriented  Airway & Oxygen Therapy: Patient Spontanous Breathing  Post-op Assessment: Report given to RN and Post -op Vital signs reviewed and stable  Post vital signs: Reviewed and stable  Last Vitals:  Vitals:   12/21/16 0759 12/21/16 1004  BP: (!) 150/81 (!) 158/88  Pulse: 72 74  Resp: 18 16  Temp: 36.2 C     Last Pain:  Vitals:   12/21/16 0759  TempSrc: Tympanic         Complications: No apparent anesthesia complications

## 2016-12-21 NOTE — H&P (Signed)
All labs reviewed. Abnormal studies sent to patients PCP when indicated.  Previous H&P reviewed, patient examined, there are NO CHANGES.  George Gordon LOUIS6/12/20189:22 AM

## 2016-12-21 NOTE — Op Note (Signed)
PREOPERATIVE DIAGNOSIS:  Nuclear sclerotic cataract of the right eye.   POSTOPERATIVE DIAGNOSIS:  nuclear sclerotic cataract right eye   OPERATIVE PROCEDURE: Procedure(s): CATARACT EXTRACTION PHACO AND INTRAOCULAR LENS PLACEMENT (IOC)   SURGEON:  Birder Robson, MD.   ANESTHESIA:  Anesthesiologist: Gunnar Fusi, MD CRNA: Jonna Clark, CRNA  1.      Managed anesthesia care. 2.      0.46ml of Shugarcaine was instilled in the eye following the paracentesis.   COMPLICATIONS:  None.   TECHNIQUE:   Stop and chop   DESCRIPTION OF PROCEDURE:  The patient was examined and consented in the preoperative holding area where the aforementioned topical anesthesia was applied to the right eye and then brought back to the Operating Room where the right eye was prepped and draped in the usual sterile ophthalmic fashion and a lid speculum was placed. A paracentesis was created with the side port blade and the anterior chamber was filled with viscoelastic. A near clear corneal incision was performed with the steel keratome. A continuous curvilinear capsulorrhexis was performed with a cystotome followed by the capsulorrhexis forceps. Hydrodissection and hydrodelineation were carried out with BSS on a blunt cannula. The lens was removed in a stop and chop  technique and the remaining cortical material was removed with the irrigation-aspiration handpiece. The capsular bag was inflated with viscoelastic and the Technis [id number]  lens was placed in the capsular bag without complication. The remaining viscoelastic was removed from the eye with the irrigation-aspiration handpiece. The wounds were hydrated. The anterior chamber was flushed with Miostat and the eye was inflated to physiologic pressure. 0.39ml of Vigamox was placed in the anterior chamber. The wounds were found to be water tight. The eye was dressed with Vigamox. The patient was given protective glasses to wear throughout the day and a shield with  which to sleep tonight. The patient was also given drops with which to begin a drop regimen today and will follow-up with me in one day.  Implant Name Type Inv. Item Serial No. Manufacturer Lot No. LRB No. Used  LENS IOL DIOP 23.0 - [id number] Intraocular Lens LENS IOL DIOP 23.0 [id number] AMO   Right 1   Procedure(s) with comments: CATARACT EXTRACTION PHACO AND INTRAOCULAR LENS PLACEMENT (IOC) (Right) - [place] 00:27 AP% 11.3 CDE 3.08 fluid pack lot # [id number]  Electronically signed: [place] 12/21/2016 10:01 AM

## 2016-12-21 NOTE — Anesthesia Postprocedure Evaluation (Signed)
Anesthesia Post Note  Patient: George Gordon  Procedure(s) Performed: Procedure(s) (LRB): CATARACT EXTRACTION PHACO AND INTRAOCULAR LENS PLACEMENT (IOC) (Right)  Patient location during evaluation: Short Stay Anesthesia Type: MAC Level of consciousness: awake, awake and alert and oriented Pain management: pain level controlled Vital Signs Assessment: post-procedure vital signs reviewed and stable Respiratory status: spontaneous breathing Cardiovascular status: stable Postop Assessment: no headache and adequate PO intake Anesthetic complications: no     Last Vitals:  Vitals:   12/21/16 1002 12/21/16 1004  BP:  (!) 158/88  Pulse: 69 74  Resp: 18 16  Temp: 36.4 C     Last Pain:  Vitals:   12/21/16 0759  TempSrc: Tympanic                 Lanora Manis

## 2016-12-21 NOTE — Anesthesia Post-op Follow-up Note (Cosign Needed)
Anesthesia QCDR form completed.        

## 2016-12-22 ENCOUNTER — Encounter: Payer: Self-pay | Admitting: Ophthalmology

## 2017-01-03 DIAGNOSIS — H2512 Age-related nuclear cataract, left eye: Secondary | ICD-10-CM | POA: Diagnosis not present

## 2017-01-11 ENCOUNTER — Ambulatory Visit: Payer: BLUE CROSS/BLUE SHIELD | Admitting: Anesthesiology

## 2017-01-11 ENCOUNTER — Encounter: Payer: Self-pay | Admitting: *Deleted

## 2017-01-11 ENCOUNTER — Encounter
Admission: RE | Disposition: A | Discharge status: Home / Self Care | Payer: Self-pay | Source: Ambulatory Visit | Attending: Ophthalmology

## 2017-01-11 ENCOUNTER — Ambulatory Visit
Admission: RE | Admit: 2017-01-11 | Discharge: 2017-01-11 | Disposition: A | Discharge status: Home / Self Care | Payer: BLUE CROSS/BLUE SHIELD | Source: Ambulatory Visit | Attending: Ophthalmology | Admitting: Ophthalmology

## 2017-01-11 DIAGNOSIS — I10 Essential (primary) hypertension: Secondary | ICD-10-CM | POA: Diagnosis not present

## 2017-01-11 DIAGNOSIS — H2512 Age-related nuclear cataract, left eye: Secondary | ICD-10-CM | POA: Insufficient documentation

## 2017-01-11 DIAGNOSIS — E119 Type 2 diabetes mellitus without complications: Secondary | ICD-10-CM | POA: Insufficient documentation

## 2017-01-11 HISTORY — PX: CATARACT EXTRACTION W/PHACO: SHX586

## 2017-01-11 LAB — GLUCOSE, CAPILLARY: Glucose-Capillary: 118 mg/dL — ABNORMAL HIGH (ref 65–99)

## 2017-01-11 SURGERY — PHACOEMULSIFICATION, CATARACT, WITH IOL INSERTION
Anesthesia: Monitor Anesthesia Care | Site: Eye | Laterality: Left | Wound class: Clean

## 2017-01-11 MED ORDER — SODIUM CHLORIDE 0.9 % IV SOLN
INTRAVENOUS | Status: DC
  Administered 2017-01-11: 07:00:00 via INTRAVENOUS

## 2017-01-11 MED ORDER — MOXIFLOXACIN HCL 0.5 % OP SOLN: 1.0000 [drp] | OPHTHALMIC | Status: DC | PRN

## 2017-01-11 MED ORDER — MOXIFLOXACIN HCL 0.5 % OP SOLN
OPHTHALMIC | Status: DC
Start: 2017-01-11 — End: 2017-01-11
  Filled 2017-01-11: qty 3

## 2017-01-11 MED ORDER — MOXIFLOXACIN HCL 0.5 % OP SOLN
OPHTHALMIC | Status: DC | PRN
Start: 2017-01-11 — End: 2017-01-11
  Administered 2017-01-11: .2 mL via OPHTHALMIC

## 2017-01-11 MED ORDER — EPINEPHRINE PF 1 MG/ML IJ SOLN
INTRAOCULAR | Status: DC | PRN
  Administered 2017-01-11: 1 mL via OPHTHALMIC

## 2017-01-11 MED ORDER — FENTANYL CITRATE (PF) 100 MCG/2ML IJ SOLN
INTRAMUSCULAR | Status: DC | PRN
  Administered 2017-01-11: 50 ug via INTRAVENOUS

## 2017-01-11 MED ORDER — MIDAZOLAM HCL 2 MG/2ML IJ SOLN
INTRAMUSCULAR | Status: AC
  Filled 2017-01-11: qty 2

## 2017-01-11 MED ORDER — CARBACHOL 0.01 % IO SOLN
INTRAOCULAR | Status: DC | PRN
  Administered 2017-01-11: .5 mL via INTRAOCULAR

## 2017-01-11 MED ORDER — NA CHONDROIT SULF-NA HYALURON 40-17 MG/ML IO SOLN
INTRAOCULAR | Status: DC | PRN
  Administered 2017-01-11: 1 mL via INTRAOCULAR

## 2017-01-11 MED ORDER — MIDAZOLAM HCL 2 MG/2ML IJ SOLN
INTRAMUSCULAR | Status: DC | PRN
  Administered 2017-01-11: 1 mg via INTRAVENOUS

## 2017-01-11 MED ORDER — LIDOCAINE HCL (PF) 4 % IJ SOLN
INTRAMUSCULAR | Status: DC | PRN
  Administered 2017-01-11: 2 mL via OPHTHALMIC

## 2017-01-11 MED ORDER — ARMC OPHTHALMIC DILATING DROPS
OPHTHALMIC | Status: AC
  Administered 2017-01-11: 1 via OPHTHALMIC
  Filled 2017-01-11: qty 0.4

## 2017-01-11 MED ORDER — FENTANYL CITRATE (PF) 100 MCG/2ML IJ SOLN
INTRAMUSCULAR | Status: AC
Start: 2017-01-11 — End: 2017-01-11
  Filled 2017-01-11: qty 2

## 2017-01-11 MED ORDER — ARMC OPHTHALMIC DILATING DROPS
1.0000 "application " | OPHTHALMIC | Status: AC
  Administered 2017-01-11 (×3): 1 via OPHTHALMIC

## 2017-01-11 MED ORDER — POVIDONE-IODINE 5 % OP SOLN
OPHTHALMIC | Status: DC | PRN
Start: 2017-01-11 — End: 2017-01-11
  Administered 2017-01-11: 1 via OPHTHALMIC

## 2017-01-11 SURGICAL SUPPLY — 14 items
GLOVE BIO SURGEON STRL SZ8 (GLOVE) ×2 IMPLANT
GLOVE BIOGEL M 6.5 STRL (GLOVE) ×2 IMPLANT
GLOVE SURG LX 8.0 MICRO (GLOVE) ×1
GLOVE SURG LX STRL 8.0 MICRO (GLOVE) ×1 IMPLANT
GOWN STRL REUS W/ TWL LRG LVL3 (GOWN DISPOSABLE) ×2 IMPLANT
GOWN STRL REUS W/TWL LRG LVL3 (GOWN DISPOSABLE) ×2
LENS IOL TECNIS ITEC 22.5 (Intraocular Lens) ×2 IMPLANT
PACK CATARACT (MISCELLANEOUS) ×2 IMPLANT
PACK CATARACT BRASINGTON LX (MISCELLANEOUS) ×2 IMPLANT
SOL BSS BAG (MISCELLANEOUS) ×2
SOLUTION BSS BAG (MISCELLANEOUS) ×1 IMPLANT
SYR 5ML LL (SYRINGE) ×2 IMPLANT
WATER STERILE IRR 250ML POUR (IV SOLUTION) ×2 IMPLANT
WIPE NON LINTING 3.25X3.25 (MISCELLANEOUS) ×2 IMPLANT

## 2017-01-11 NOTE — Addendum Note (Signed)
Addendum  created 01/11/17 4320 by Hedda Slade, CRNA   Anesthesia Intra Flowsheets edited

## 2017-01-11 NOTE — Anesthesia Preprocedure Evaluation (Signed)
Anesthesia Evaluation  Patient identified by MRN, date of birth, ID band Patient awake    Reviewed: Allergy & Precautions, NPO status , Patient's Chart, lab work & pertinent test results  History of Anesthesia Complications Negative for: history of anesthetic complications  Airway Mallampati: II  TM Distance: >3 FB Neck ROM: full    Dental  (+) Poor Dentition   Pulmonary neg pulmonary ROS,           Cardiovascular hypertension, Pt. on medications      Neuro/Psych negative neurological ROS     GI/Hepatic negative GI ROS, Neg liver ROS,   Endo/Other  diabetes, Type 2, Oral Hypoglycemic Agents  Renal/GU      Musculoskeletal   Abdominal   Peds  Hematology   Anesthesia Other Findings Past Medical History: No date: Diabetes mellitus without complication (HCC) No date: Hyperlipidemia No date: Hypertension   Reproductive/Obstetrics                             Anesthesia Physical  Anesthesia Plan  ASA: III  Anesthesia Plan: MAC   Post-op Pain Management:    Induction: Intravenous  PONV Risk Score and Plan: 1 and Ondansetron and Dexamethasone  Airway Management Planned:   Additional Equipment:   Intra-op Plan:   Post-operative Plan:   Informed Consent: I have reviewed the patients History and Physical, chart, labs and discussed the procedure including the risks, benefits and alternatives for the proposed anesthesia with the patient or authorized representative who has indicated his/her understanding and acceptance.     Plan Discussed with:   Anesthesia Plan Comments:         Anesthesia Quick Evaluation

## 2017-01-11 NOTE — Anesthesia Postprocedure Evaluation (Signed)
Anesthesia Post Note  Patient: George Gordon  Procedure(s) Performed: Procedure(s) (LRB): CATARACT EXTRACTION PHACO AND INTRAOCULAR LENS PLACEMENT (IOC) (Left)  Patient location during evaluation: PACU Anesthesia Type: MAC Level of consciousness: awake Pain management: pain level controlled Vital Signs Assessment: post-procedure vital signs reviewed and stable Respiratory status: spontaneous breathing Cardiovascular status: blood pressure returned to baseline Postop Assessment: no headache, no backache, no signs of nausea or vomiting and adequate PO intake Anesthetic complications: no     Last Vitals:  Vitals:   01/11/17 0641 01/11/17 0846  BP: (!) 156/84 (!) 160/80  Pulse: 72   Resp: 18 12  Temp: 36.9 C 36.7 C    Last Pain:  Vitals:   01/11/17 0641  TempSrc: Oral  PainSc: 0-No pain                 [name]

## 2017-01-11 NOTE — Op Note (Signed)
PREOPERATIVE DIAGNOSIS:  Nuclear sclerotic cataract of the left eye.   POSTOPERATIVE DIAGNOSIS:  Nuclear sclerotic cataract of the left eye.   OPERATIVE PROCEDURE: Procedure(s): CATARACT EXTRACTION PHACO AND INTRAOCULAR LENS PLACEMENT (IOC)   SURGEON:  Birder Robson, MD.   ANESTHESIA:  Anesthesiologist: Piscitello, Precious Haws, MD CRNA: Hedda Slade, CRNA  1.      Managed anesthesia care. 2.     0.26ml of Shugarcaine was instilled following the paracentesis   COMPLICATIONS:  None.   TECHNIQUE:   Stop and chop   DESCRIPTION OF PROCEDURE:  The patient was examined and consented in the preoperative holding area where the aforementioned topical anesthesia was applied to the left eye and then brought back to the Operating Room where the left eye was prepped and draped in the usual sterile ophthalmic fashion and a lid speculum was placed. A paracentesis was created with the side port blade and the anterior chamber was filled with viscoelastic. A near clear corneal incision was performed with the steel keratome. A continuous curvilinear capsulorrhexis was performed with a cystotome followed by the capsulorrhexis forceps. Hydrodissection and hydrodelineation were carried out with BSS on a blunt cannula. The lens was removed in a stop and chop  technique and the remaining cortical material was removed with the irrigation-aspiration handpiece. The capsular bag was inflated with viscoelastic and the Technis ZCB00 lens was placed in the capsular bag without complication. The remaining viscoelastic was removed from the eye with the irrigation-aspiration handpiece. The wounds were hydrated. The anterior chamber was flushed with Miostat and the eye was inflated to physiologic pressure. 0.55ml Vigamox was placed in the anterior chamber. The wounds were found to be water tight. The eye was dressed with Vigamox. The patient was given protective glasses to wear throughout the day and a shield with which to sleep  tonight. The patient was also given drops with which to begin a drop regimen today and will follow-up with me in one day.  Implant Name Type Inv. Item Serial No. Manufacturer Lot No. LRB No. Used  IMPLANT RECORD             1    Procedure(s) with comments: CATARACT EXTRACTION PHACO AND INTRAOCULAR LENS PLACEMENT (IOC) (Left) - [place]  00:30 AP% 13.0 CDE 3.93 Fluid pack lot # [id number]  Electronically signed: [place] 01/11/2017 8:43 AM

## 2017-01-11 NOTE — Anesthesia Post-op Follow-up Note (Cosign Needed)
Anesthesia QCDR form completed.        

## 2017-01-11 NOTE — Discharge Instructions (Signed)
Eye Surgery Discharge Instructions  Expect mild scratchy sensation or mild soreness. DO NOT RUB YOUR EYE!  The day of surgery:  Minimal physical activity, but bed rest is not required  No reading, computer work, or close hand work  No bending, lifting, or straining.  May watch TV  For 24 hours:  No driving, legal decisions, or alcoholic beverages  Safety precautions  Eat anything you prefer: It is better to start with liquids, then soup then solid foods.  _____ Eye patch should be worn until postoperative exam tomorrow.  ____ Solar shield eyeglasses should be worn for comfort in the sunlight/patch while sleeping  Resume all regular medications including aspirin or Coumadin if these were discontinued prior to surgery. You may shower, bathe, shave, or wash your hair. Tylenol may be taken for mild discomfort.  Call your doctor if you experience significant pain, nausea, or vomiting, fever > 101 or other signs of infection. [phone] or [phone] Specific instructions:  Follow-up Information    Birder Robson, MD Follow up.   Specialty:  Ophthalmology Why:  3:25 Contact information: [address] [place] [address] [phone]

## 2017-01-11 NOTE — Transfer of Care (Signed)
Immediate Anesthesia Transfer of Care Note  Patient: George Gordon  Procedure(s) Performed: Procedure(s) with comments: CATARACT EXTRACTION PHACO AND INTRAOCULAR LENS PLACEMENT (IOC) (Left) - [place]  00:30 AP% 13.0 CDE 3.93 Fluid pack lot # [id number]  Patient Location: PACU  Anesthesia Type:MAC  Level of Consciousness: awake, alert  and oriented  Airway & Oxygen Therapy: Patient Spontanous Breathing  Post-op Assessment: Report given to RN and Post -op Vital signs reviewed and stable  Post vital signs: Reviewed and stable  Last Vitals:  Vitals:   01/11/17 0641 01/11/17 0846  BP: (!) 156/84 (!) 160/80  Pulse: 72   Resp: 18 12  Temp: 36.9 C 36.7 C    Last Pain:  Vitals:   01/11/17 0641  TempSrc: Oral  PainSc: 0-No pain         Complications: No apparent anesthesia complications

## 2017-01-11 NOTE — H&P (Signed)
All labs reviewed. Abnormal studies sent to patients PCP when indicated.  Previous H&P reviewed, patient examined, there are NO CHANGES.  [name] LOUIS7/3/20188:21 AM

## 2017-01-13 ENCOUNTER — Encounter: Payer: Self-pay | Admitting: Ophthalmology

## 2017-04-08 ENCOUNTER — Telehealth: Payer: Self-pay

## 2017-04-08 ENCOUNTER — Other Ambulatory Visit: Payer: Self-pay

## 2017-04-08 MED ORDER — GLUCOSE BLOOD VI STRP
ORAL_STRIP | 3 refills | Status: DC
Start: 2017-04-08 — End: 2017-07-08

## 2017-04-08 NOTE — Telephone Encounter (Signed)
Patient came in today with questions regarding glucose strips. Spoke with patient and sent in new refills. Patient was advised to call our office if he has any problems

## 2017-05-13 ENCOUNTER — Telehealth: Payer: Self-pay

## 2017-05-13 NOTE — Telephone Encounter (Addendum)
Patient walks  in states he noticed a rash that appeared on his neck at the start of this week.  Rash in appearance is small papules, not itchy.  Patient hasn't applied any creams , he has taken over the  counter Zyrtec with no relief.   He has  discontinued his Glipizide yesterday as he  his thinks it may be related to this.   He has not changed soaps or detergents.   He states he normally receives brand name Glucotrol from pharmacy and they have been dispensing him glipizide.   I called [facility] and they verified patient has been getting from same manufacturer Gala Romney since March.  I checked patients Blood pressure in Left arm 160/80 pulse 83 o2 97% Right Arm 166/84 .  Patient is on losartan-HCTZ for blood pressure. Patient previously developed similar rash when he rash when he was on amlodipine.   Per verbal orders patient is to stop glipizide for 2 weeks and and monitor blood sugar if blood sugars are elevated he should contact.   Patient advised to contact office for further instructions.

## 2017-05-26 ENCOUNTER — Telehealth: Payer: Self-pay

## 2017-05-26 NOTE — Telephone Encounter (Signed)
Please advise   Copied from [place] [phone]. Topic: General - Other >> May 26, 2017  8:46 AM Ivar Drape wrote: Reason for CRM: Patient was advised to call Dr. Lupita Dawn nurse on the 13th concerning Glipizide medication. Please call

## 2017-05-27 ENCOUNTER — Encounter: Payer: Self-pay | Admitting: Internal Medicine

## 2017-05-27 ENCOUNTER — Ambulatory Visit (INDEPENDENT_AMBULATORY_CARE_PROVIDER_SITE_OTHER): Payer: BLUE CROSS/BLUE SHIELD | Admitting: Internal Medicine

## 2017-05-27 ENCOUNTER — Telehealth: Payer: Self-pay

## 2017-05-27 DIAGNOSIS — R5383 Other fatigue: Secondary | ICD-10-CM

## 2017-05-27 DIAGNOSIS — I10 Essential (primary) hypertension: Secondary | ICD-10-CM

## 2017-05-27 DIAGNOSIS — E1165 Type 2 diabetes mellitus with hyperglycemia: Secondary | ICD-10-CM

## 2017-05-27 DIAGNOSIS — E118 Type 2 diabetes mellitus with unspecified complications: Secondary | ICD-10-CM

## 2017-05-27 DIAGNOSIS — IMO0002 Reserved for concepts with insufficient information to code with codable children: Secondary | ICD-10-CM

## 2017-05-27 DIAGNOSIS — J101 Influenza due to other identified influenza virus with other respiratory manifestations: Secondary | ICD-10-CM

## 2017-05-27 NOTE — Telephone Encounter (Signed)
Patient called complaining of cold head ache, body aches, just doesn't feel well. Refused to be triaged. Wants office visit with you. Is there anyway we can work him in or should I advise urgent care?

## 2017-05-27 NOTE — Addendum Note (Signed)
Addended by: Arby Barrette on: 05/27/2017 03:16 PM   Modules accepted: Orders

## 2017-05-27 NOTE — Telephone Encounter (Signed)
LMTCB

## 2017-05-27 NOTE — Patient Instructions (Signed)
You have Influenza A,  But you are "outside the window" of effective treatment(48 hours)  With TamiFlu  You can use tylenol and motrin or aleve to mange the body aches and headaches  Maximus dose of tylenol is 4000 mg daily.   Choose aleve or motrin ,  Not both ,  To combine or alternate with tylenol.   You can try [employer]'s Sinus rinse to irrigate your sinuses  ;  It is a stong sinus "flush" using water and medicated salts.  Do it over the sink because it can be a bit messy  Ok to use robitussin for cough.  All the office if you develop fevers, facial pain,  Or productive cough    Tell your wife to notify   her OB that you have the flu    Influenza, Adult Influenza ("the flu") is an infection in the lungs, nose, and throat (respiratory tract). It is caused by a virus. The flu causes many common cold symptoms, as well as a high fever and body aches. It can make you feel very sick. The flu spreads easily from person to person (is contagious). Getting a flu shot (influenza vaccination) every year is the best way to prevent the flu. Follow these instructions at home:  Take over-the-counter and prescription medicines only as told by your doctor.  Use a cool mist humidifier to add moisture (humidity) to the air in your home. This can make it easier to breathe.  Rest as needed.  Drink enough fluid to keep your pee (urine) clear or pale yellow.  Cover your mouth and nose when you cough or sneeze.  Wash your hands with soap and water often, especially after you cough or sneeze. If you cannot use soap and water, use hand sanitizer.  Stay home from work or school as told by your doctor. Unless you are visiting your doctor, try to avoid leaving home until your fever has been gone for 24 hours without the use of medicine.  Keep all follow-up visits as told by your doctor. This is important. How is this prevented?  Getting a yearly (annual) flu shot is the best way to avoid getting the flu. You  may get the flu shot in late summer, fall, or winter. Ask your doctor when you should get your flu shot.  Wash your hands often or use hand sanitizer often.  Avoid contact with people who are sick during cold and flu season.  Eat healthy foods.  Drink plenty of fluids.  Get enough sleep.  Exercise regularly. Contact a doctor if:  You get new symptoms.  You have: ? Chest pain. ? Watery poop (diarrhea). ? A fever.  Your cough gets worse.  You start to have more mucus.  You feel sick to your stomach (nauseous).  You throw up (vomit). Get help right away if:  You start to be short of breath or have trouble breathing.  Your skin or nails turn a bluish color.  You have very bad pain or stiffness in your neck.  You get a sudden headache.  You get sudden pain in your face or ear.  You cannot stop throwing up. This information is not intended to replace advice given to you by your health care provider. Make sure you discuss any questions you have with your health care provider. Document Released: 04/06/2008 Document Revised: 12/04/2015 Document Reviewed: 04/22/2015 Elsevier Interactive Patient Education  2017 [employer].

## 2017-05-27 NOTE — Telephone Encounter (Signed)
Patient scheduled and will be here at 4:00 to have labs drawn prior to visit

## 2017-05-27 NOTE — Progress Notes (Signed)
Subjective:  Patient ID: George Gordon, male    DOB: [birth year]  Age: 56 y.o. MRN: [id number]  CC: Diagnoses of Fatigue, unspecified type, Essential hypertension, Diabetes mellitus type 2, uncontrolled, with complications ([place]), and Influenza A (H1N1) were pertinent to this visit.  HPI George Gordon presents for evaluation and treatment of headache and body aches that started 4 days ago while attending a conference out of town.  He had flown [place] from Nov  7 and returned on Nov 14th.   He reports having significant ear pain during the flight home.   No previous flu vaccination.  Has avoide contact with pregnant wife since returning home because of concern for spreading his illness.   Rapid Flu test positive for influenza A today     Uncontrolled diabetes mellitus: has not been seen since January .  States that his blood sugars and diabetes control has improved because his wife is pregnant and on a strict diet which he is also adhering to .   Outpatient Medications Prior to Visit  Medication Sig Dispense Refill  . aspirin EC 81 MG tablet Take 81 mg by mouth daily.    . Cetirizine HCl (ZYRTEC ALLERGY) 10 MG CAPS One tablet daily as needed for seasonal allergies 30 capsule 5  . folic acid (FOLVITE) 034 MCG tablet Take 400 mcg by mouth daily. Reported on 07/25/2015    . glipiZIDE (GLUCOTROL) 5 MG tablet TAKE ONE-HALF (2.5 MG) TABLET BY MOUTH IN THE MORNING AND ONE TABLET (5 MG) IN THE EVENING 60 tablet 5  . glucose blood test strip Please dispense Contour Next test strips to check Blood glucose twice a day, (90 day supply) E11.9 200 each 3  . losartan-hydrochlorothiazide (HYZAAR) 50-12.5 MG tablet Take 1 tablet by mouth daily. 90 tablet 3  . vitamin C (ASCORBIC ACID) 500 MG tablet Take 500 mg by mouth daily. Reported on 07/25/2015    . vitamin E 400 UNIT capsule Take 400 Units by mouth daily.     No facility-administered medications prior to visit.     Review of Systems;  Patient  denies headache, fevers, malaise, unintentional weight loss, skin rash, eye pain, sinus congestion and sinus pain, sore throat, dysphagia,  hemoptysis , cough, dyspnea, wheezing, chest pain, palpitations, orthopnea, edema, abdominal pain, nausea, melena, diarrhea, constipation, flank pain, dysuria, hematuria, urinary  Frequency, nocturia, numbness, tingling, seizures,  Focal weakness, Loss of consciousness,  Tremor, insomnia, depression, anxiety, and suicidal ideation.      Objective:  BP (!) 144/78 (BP Location: Left Arm, Patient Position: Sitting, Cuff Size: Normal)   Pulse 72   Temp 98.1 F (36.7 C) (Oral)   Resp 16   Ht 5\' 8"  (1.727 m)   Wt 160 lb 6.4 oz (72.8 kg)   SpO2 96%   BMI 24.39 kg/m   BP Readings from Last 3 Encounters:  05/27/17 (!) 144/78  01/11/17 (!) 142/88  12/21/16 (!) 156/82    Wt Readings from Last 3 Encounters:  05/27/17 160 lb 6.4 oz (72.8 kg)  01/11/17 151 lb (68.5 kg)  12/21/16 148 lb (67.1 kg)    General appearance: alert, cooperative and appears stated age Ears: normal TM's and external ear canals both ears Throat: lips, mucosa, and tongue normal; teeth and gums normal Neck: no adenopathy, no carotid bruit, supple, symmetrical, trachea midline and thyroid not enlarged, symmetric, no tenderness/mass/nodules Back: symmetric, no curvature. ROM normal. No CVA tenderness. Lungs: clear to auscultation bilaterally Heart: regular rate and rhythm, S1, S2  normal, no murmur, click, rub or gallop Abdomen: soft, non-tender; bowel sounds normal; no masses,  no organomegaly Pulses: 2+ and symmetric Skin: Skin color, texture, turgor normal. No rashes or lesions Lymph nodes: Cervical, supraclavicular, and axillary nodes normal.  Lab Results  Component Value Date   HGBA1C 8.6 (H) 05/27/2017   HGBA1C 7.8 (H) 08/05/2016   HGBA1C 7.2 (H) 05/05/2016    Lab Results  Component Value Date   CREATININE 1.26 05/27/2017   CREATININE 1.05 08/05/2016   CREATININE 1.2  05/14/2016    Lab Results  Component Value Date   WBC 7.7 05/27/2017   HGB 16.3 05/27/2017   HCT 49.1 05/27/2017   PLT 174 05/27/2017   GLUCOSE 85 05/27/2017   CHOL 297 (H) 08/05/2016   TRIG 95.0 08/05/2016   HDL 48.20 08/05/2016   LDLDIRECT 214.0 08/05/2016   LDLCALC 230 (H) 08/05/2016   ALT 29 05/27/2017   AST 23 05/27/2017   NA 140 05/27/2017   K 3.8 05/27/2017   CL 100 05/27/2017   CREATININE 1.26 05/27/2017   BUN 17 05/27/2017   CO2 32 05/27/2017   HGBA1C 8.6 (H) 05/27/2017   MICROALBUR 1.2 08/05/2016    No results found.  Assessment & Plan:   Problem List Items Addressed This Visit    Diabetes mellitus type 2, uncontrolled, with complications ([place])    Worsening control despite patient's history of improving adherence to diet and medications.  Will need to start insulin if increase glipizide dose to 10 mg bid  Does not bring sugars down.  follow up in one month with log of blood sugars   Lab Results  Component Value Date   HGBA1C 8.6 (H) 05/27/2017   Lab Results  Component Value Date   MICROALBUR 1.2 08/05/2016   Lab Results  Component Value Date   ALT 29 05/27/2017   AST 23 05/27/2017   ALKPHOS 84 08/05/2016   BILITOT 0.7 05/27/2017         Essential hypertension   Influenza A (H1N1)    Outside of treatment window . Marland Kitchen Advised to have pregnant wife contact her OB and to use droplet and airborne precautions. Mask given .       Other Visit Diagnoses    Fatigue, unspecified type          I am having [name] maintain his vitamin C, folic acid, Cetirizine HCl, glipiZIDE, losartan-hydrochlorothiazide, aspirin EC, vitamin E, and glucose blood.  No orders of the defined types were placed in this encounter.   There are no discontinued medications.  Follow-up: No Follow-up on file.   Crecencio Mc, MD

## 2017-05-27 NOTE — Telephone Encounter (Signed)
Pt is in the office today.  

## 2017-05-27 NOTE — Addendum Note (Signed)
Addended by: Lars Masson on: 05/27/2017 02:04 PM   Modules accepted: Orders

## 2017-05-27 NOTE — Telephone Encounter (Signed)
Yes we can work him in this afternoon.  He needs a CBC. CMEt and A1c for labs and flu test as soon as he is roomed.

## 2017-05-27 NOTE — Addendum Note (Signed)
Addended by: Arby Barrette on: 05/27/2017 03:17 PM   Modules accepted: Orders

## 2017-05-28 LAB — COMPREHENSIVE METABOLIC PANEL
AG RATIO: 1.2 (calc) (ref 1.0–2.5)
ALKALINE PHOSPHATASE (APISO): 82 U/L (ref 40–115)
ALT: 29 U/L (ref 9–46)
AST: 23 U/L (ref 10–35)
Albumin: 4.1 g/dL (ref 3.6–5.1)
BUN: 17 mg/dL (ref 7–25)
CALCIUM: 9.7 mg/dL (ref 8.6–10.3)
CHLORIDE: 100 mmol/L (ref 98–110)
CO2: 32 mmol/L (ref 20–32)
Creat: 1.26 mg/dL (ref 0.70–1.33)
GLOBULIN: 3.3 g/dL (ref 1.9–3.7)
GLUCOSE: 85 mg/dL (ref 65–99)
POTASSIUM: 3.8 mmol/L (ref 3.5–5.3)
SODIUM: 140 mmol/L (ref 135–146)
Total Bilirubin: 0.7 mg/dL (ref 0.2–1.2)
Total Protein: 7.4 g/dL (ref 6.1–8.1)

## 2017-05-28 LAB — CBC WITH DIFFERENTIAL/PLATELET
BASOS ABS: 31 {cells}/uL (ref 0–200)
Basophils Relative: 0.4 %
EOS ABS: 69 {cells}/uL (ref 15–500)
Eosinophils Relative: 0.9 %
HEMATOCRIT: 49.1 % (ref 38.5–50.0)
HEMOGLOBIN: 16.3 g/dL (ref 13.2–17.1)
LYMPHS ABS: 4258 {cells}/uL — AB (ref 850–3900)
MCH: 29.6 pg (ref 27.0–33.0)
MCHC: 33.2 g/dL (ref 32.0–36.0)
MCV: 89.3 fL (ref 80.0–100.0)
MONOS PCT: 7.5 %
MPV: 12 fL (ref 7.5–12.5)
NEUTROS ABS: 2764 {cells}/uL (ref 1500–7800)
Neutrophils Relative %: 35.9 %
Platelets: 174 10*3/uL (ref 140–400)
RBC: 5.5 10*6/uL (ref 4.20–5.80)
RDW: 13.2 % (ref 11.0–15.0)
Total Lymphocyte: 55.3 %
WBC: 7.7 10*3/uL (ref 3.8–10.8)
WBCMIX: 578 {cells}/uL (ref 200–950)

## 2017-05-28 LAB — HEMOGLOBIN A1C
EAG (MMOL/L): 11.1 (calc)
Hgb A1c MFr Bld: 8.6 % of total Hgb — ABNORMAL HIGH (ref <5.7)
Mean Plasma Glucose: 200 (calc)

## 2017-05-29 ENCOUNTER — Encounter: Payer: Self-pay | Admitting: Internal Medicine

## 2017-05-29 DIAGNOSIS — J101 Influenza due to other identified influenza virus with other respiratory manifestations: Secondary | ICD-10-CM | POA: Insufficient documentation

## 2017-05-29 MED ORDER — GLIPIZIDE 10 MG PO TABS: 10.0000 mg | ORAL_TABLET | Freq: Two times a day (BID) | ORAL | 1 refills | Status: DC

## 2017-05-29 NOTE — Assessment & Plan Note (Addendum)
Worsening control despite patient's history of improving adherence to diet and medications.  Will need to start insulin if increase glipizide dose to 10 mg bid  Does not bring sugars down.  follow up in one month with log of blood sugars   Lab Results  Component Value Date   HGBA1C 8.6 (H) 05/27/2017   Lab Results  Component Value Date   MICROALBUR 1.2 08/05/2016   Lab Results  Component Value Date   ALT 29 05/27/2017   AST 23 05/27/2017   ALKPHOS 84 08/05/2016   BILITOT 0.7 05/27/2017

## 2017-05-29 NOTE — Assessment & Plan Note (Signed)
Outside of treatment window . Marland Kitchen Advised to have pregnant wife contact her OB and to use droplet and airborne precautions. Mask given .

## 2017-07-08 ENCOUNTER — Telehealth: Payer: Self-pay | Admitting: Internal Medicine

## 2017-07-08 MED ORDER — GLUCOSE BLOOD VI STRP: ORAL_STRIP | 3 refills | Status: DC

## 2017-07-08 NOTE — Telephone Encounter (Signed)
Copied from [place] [id number]. Topic: Quick Communication - See Telephone Encounter >> Jul 08, 2017  8:53 AM Oneta Rack wrote: CRM for notification. See Telephone encounter for:   07/08/17.  Relation to IV:HSJW Call back number: [phone] Pharmacy: [place], [place] [phone] (Fax)    Reason for call:  Patient requesting glucose blood test strip, patient contacted pharmacy, informed patient please allow 48 to 72 hour turnaround, please advise

## 2017-07-08 NOTE — Telephone Encounter (Signed)
Copied from [place] [id number]. Topic: Quick Communication - See Telephone Encounter >> Jul 08, 2017  8:53 AM Oneta Rack wrote: CRM for notification. See Telephone encounter for:   07/08/17.  Relation to HO:YWVX Call back number: [phone] Pharmacy: [place], [place] [phone] (Fax)    Reason for call:  Patient requesting glucose blood test strip, patient contacted pharmacy, informed patient please allow 48 to 72 hour turnaround, please advise

## 2017-07-08 NOTE — Telephone Encounter (Signed)
Script sent patient aware

## 2017-08-15 DIAGNOSIS — E113212 Type 2 diabetes mellitus with mild nonproliferative diabetic retinopathy with macular edema, left eye: Secondary | ICD-10-CM | POA: Diagnosis not present

## 2017-08-15 LAB — HM DIABETES EYE EXAM

## 2017-09-05 DIAGNOSIS — E113212 Type 2 diabetes mellitus with mild nonproliferative diabetic retinopathy with macular edema, left eye: Secondary | ICD-10-CM | POA: Diagnosis not present

## 2017-09-05 LAB — HM DIABETES EYE EXAM

## 2017-09-21 ENCOUNTER — Ambulatory Visit: Payer: BLUE CROSS/BLUE SHIELD | Admitting: Internal Medicine

## 2017-09-21 ENCOUNTER — Encounter: Payer: Self-pay | Admitting: Internal Medicine

## 2017-09-21 VITALS — BP 140/66 | HR 79 | Temp 98.1°F | Resp 15 | Ht 68.0 in | Wt 163.4 lb

## 2017-09-21 DIAGNOSIS — E118 Type 2 diabetes mellitus with unspecified complications: Secondary | ICD-10-CM | POA: Diagnosis not present

## 2017-09-21 DIAGNOSIS — R5383 Other fatigue: Secondary | ICD-10-CM

## 2017-09-21 DIAGNOSIS — E1169 Type 2 diabetes mellitus with other specified complication: Secondary | ICD-10-CM | POA: Diagnosis not present

## 2017-09-21 DIAGNOSIS — Z23 Encounter for immunization: Secondary | ICD-10-CM | POA: Diagnosis not present

## 2017-09-21 DIAGNOSIS — E1165 Type 2 diabetes mellitus with hyperglycemia: Secondary | ICD-10-CM

## 2017-09-21 DIAGNOSIS — I10 Essential (primary) hypertension: Secondary | ICD-10-CM

## 2017-09-21 DIAGNOSIS — E785 Hyperlipidemia, unspecified: Secondary | ICD-10-CM

## 2017-09-21 DIAGNOSIS — IMO0002 Reserved for concepts with insufficient information to code with codable children: Secondary | ICD-10-CM
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
## 2017-09-21 NOTE — Patient Instructions (Addendum)

## 2017-09-22 ENCOUNTER — Encounter: Payer: Self-pay | Admitting: Internal Medicine

## 2017-09-22 LAB — LIPID PANEL
CHOL/HDL RATIO: 5
Cholesterol: 216 mg/dL — ABNORMAL HIGH (ref 0–200)
HDL: 43.8 mg/dL (ref >39.00)
LDL CALC: 149 mg/dL — AB (ref 0–99)
NONHDL: 172.3
Triglycerides: 117 mg/dL (ref 0.0–149.0)
VLDL: 23.4 mg/dL (ref 0.0–40.0)

## 2017-09-22 LAB — COMPREHENSIVE METABOLIC PANEL
ALK PHOS: 69 U/L (ref 39–117)
ALT: 28 U/L (ref 0–53)
AST: 17 U/L (ref 0–37)
Albumin: 3.8 g/dL (ref 3.5–5.2)
BUN: 28 mg/dL — AB (ref 6–23)
CHLORIDE: 103 meq/L (ref 96–112)
CO2: 32 meq/L (ref 19–32)
Calcium: 9.9 mg/dL (ref 8.4–10.5)
Creatinine, Ser: 1.05 mg/dL (ref 0.40–1.50)
GFR: 76.97 mL/min (ref >60.00)
GLUCOSE: 117 mg/dL — AB (ref 70–99)
POTASSIUM: 4.1 meq/L (ref 3.5–5.1)
SODIUM: 140 meq/L (ref 135–145)
TOTAL PROTEIN: 7 g/dL (ref 6.0–8.3)
Total Bilirubin: 0.5 mg/dL (ref 0.2–1.2)

## 2017-09-22 LAB — MICROALBUMIN / CREATININE URINE RATIO
Creatinine,U: 108.7 mg/dL
MICROALB UR: 0.9 mg/dL (ref 0.0–1.9)
Microalb Creat Ratio: 0.8 mg/g (ref 0.0–30.0)

## 2017-09-22 LAB — TSH: TSH: 0.84 u[IU]/mL (ref 0.35–4.50)

## 2017-09-22 LAB — HEMOGLOBIN A1C: HEMOGLOBIN A1C: 8.2 % — AB (ref 4.6–6.5)

## 2017-09-22 MED ORDER — LOSARTAN POTASSIUM-HCTZ 100-12.5 MG PO TABS
1.0000 | ORAL_TABLET | Freq: Every day | ORAL | 0 refills | Status: DC
Start: 2017-09-22 — End: 2017-12-26

## 2017-09-22 MED ORDER — SITAGLIPTIN PHOSPHATE 25 MG PO TABS: 25.0000 mg | ORAL_TABLET | Freq: Every day | ORAL | 0 refills | Status: DC

## 2017-09-22 NOTE — Assessment & Plan Note (Signed)
.  imporved with diet and better control of DM.  Remains untreated  due to statin intolerance. He did not tolerate atorvastatin secondary to rash and nausea.  Lab Results  Component Value Date   CHOL 216 (H) 09/21/2017   HDL 43.80 09/21/2017   LDLCALC 149 (H) 09/21/2017   LDLDIRECT 214.0 08/05/2016   TRIG 117.0 09/21/2017   CHOLHDL 5 09/21/2017

## 2017-09-22 NOTE — Assessment & Plan Note (Signed)
Somewhat improved with use of glipizide but still not at goal.  Intolerant of metformin.  Will allow him th eoption of starting an exercise program or adding Januvia once daily   Lab Results  Component Value Date   HGBA1C 8.2 (H) 09/21/2017   Lab Results  Component Value Date   MICROALBUR 0.9 09/21/2017
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
## 2017-09-23 ENCOUNTER — Telehealth: Payer: Self-pay | Admitting: Internal Medicine

## 2017-09-23 NOTE — Telephone Encounter (Signed)
Chart updated

## 2017-09-23 NOTE — Telephone Encounter (Signed)
Copied from [place]. Topic: Quick Communication - See Telephone Encounter >> Sep 23, 2017  3:55 PM Percell Belt A wrote: CRM for notification. See Telephone encounter for:   Pt called in and stated that he had a Tdap  at [facility] on 05/06/2016.  He is stating it was not recorded.  He stated he also had a flu shot that same date.  He would like that updated in his File.     09/23/17.

## 2017-10-03 ENCOUNTER — Telehealth: Payer: Self-pay | Admitting: Internal Medicine

## 2017-10-03 NOTE — Telephone Encounter (Signed)
Copied from [place] [id number]. Topic: Quick Communication - Rx Refill/Question >> Oct 03, 2017  3:48 PM Celedonio Savage L wrote: Medication: glipiZIDE (GLUCOTROL) 10 MG tablet   glucose blood test strip Cetirizine HCl (ZYRTEC ALLERGY) 10 MG CAPS  Has the patient contacted their pharmacy? Yes.   (Agent: If no, request that the patient contact the pharmacy for the refill.) Preferred Pharmacy (with phone number or street name):     [place], [place] - [place] [phone] (Phone) [phone] (Fax)     Agent: Please be advised that RX refills may take up to 3 business days. We ask that you follow-up with your pharmacy.

## 2017-10-04 MED ORDER — GLUCOSE BLOOD VI STRP: ORAL_STRIP | 3 refills | Status: DC

## 2017-10-04 MED ORDER — CETIRIZINE HCL 10 MG PO CAPS: ORAL_CAPSULE | ORAL | 5 refills | Status: DC

## 2017-10-04 MED ORDER — GLIPIZIDE 10 MG PO TABS: 10.0000 mg | ORAL_TABLET | Freq: Two times a day (BID) | ORAL | 0 refills | Status: DC

## 2017-10-13 ENCOUNTER — Telehealth: Payer: Self-pay | Admitting: Internal Medicine

## 2017-10-13 NOTE — Telephone Encounter (Signed)
The patient called back and stated he does not want tablets ,he would like caplets instead he says this has been an on going problem .

## 2017-10-13 NOTE — Telephone Encounter (Signed)
Patient called, left detailed VM that the prescription Zyrtec was sent to [facility] on 10/04/17 30 tablets/5 refills. Advised to contact the pharmacy for the refill and if he has question or concerns to call the office back.

## 2017-10-13 NOTE — Telephone Encounter (Signed)
Copied from [place] [id number]. Topic: Quick Communication - Rx Refill/Question >> Oct 13, 2017  4:12 PM Robina Ade, Helene Kelp D wrote: Medication: Cetirizine HCl (ZYRTEC ALLERGY) 10 MG CAPS Has the patient contacted their pharmacy? Yes, patient needs the capsule and not the tablets, please correct. (Agent: If no, request that the patient contact the pharmacy for the refill.) Preferred Pharmacy (with phone number or street name): [place], [place] - [place]: Please be advised that RX refills may take up to 3 business days. We ask that you follow-up with your pharmacy.

## 2017-10-14 ENCOUNTER — Other Ambulatory Visit: Payer: Self-pay

## 2017-10-14 NOTE — Telephone Encounter (Signed)
Called patient to inform him that the prescription for zyrtec that was sent in was for capsules he states that they were tablets. I informed him that I would call the pharmacy to get that taken care of. I called [facility] and they state the prescription was filled for capsules. FYI

## 2017-10-25 DIAGNOSIS — E113212 Type 2 diabetes mellitus with mild nonproliferative diabetic retinopathy with macular edema, left eye: Secondary | ICD-10-CM | POA: Diagnosis not present

## 2017-12-21 ENCOUNTER — Telehealth: Payer: Self-pay | Admitting: Radiology

## 2017-12-21 ENCOUNTER — Other Ambulatory Visit: Payer: BLUE CROSS/BLUE SHIELD

## 2017-12-21 DIAGNOSIS — E118 Type 2 diabetes mellitus with unspecified complications: Secondary | ICD-10-CM

## 2017-12-21 DIAGNOSIS — E1165 Type 2 diabetes mellitus with hyperglycemia: Secondary | ICD-10-CM

## 2017-12-21 DIAGNOSIS — E1169 Type 2 diabetes mellitus with other specified complication: Secondary | ICD-10-CM

## 2017-12-21 DIAGNOSIS — IMO0002 Reserved for concepts with insufficient information to code with codable children: Secondary | ICD-10-CM

## 2017-12-21 DIAGNOSIS — E785 Hyperlipidemia, unspecified: Principal | ICD-10-CM

## 2017-12-21 NOTE — Addendum Note (Signed)
Addended by: Crecencio Mc on: 12/21/2017 12:22 PM   Modules accepted: Orders

## 2017-12-21 NOTE — Telephone Encounter (Signed)
Pt coming in for labs tomorrow, please place future orders. Thank you.  

## 2017-12-22 ENCOUNTER — Other Ambulatory Visit: Payer: BLUE CROSS/BLUE SHIELD

## 2017-12-26 ENCOUNTER — Encounter: Payer: Self-pay | Admitting: Internal Medicine

## 2017-12-26 ENCOUNTER — Ambulatory Visit: Payer: BLUE CROSS/BLUE SHIELD | Admitting: Internal Medicine

## 2017-12-26 DIAGNOSIS — E785 Hyperlipidemia, unspecified: Secondary | ICD-10-CM | POA: Diagnosis not present

## 2017-12-26 DIAGNOSIS — E1165 Type 2 diabetes mellitus with hyperglycemia: Secondary | ICD-10-CM | POA: Diagnosis not present

## 2017-12-26 DIAGNOSIS — E118 Type 2 diabetes mellitus with unspecified complications: Secondary | ICD-10-CM | POA: Diagnosis not present

## 2017-12-26 DIAGNOSIS — E1169 Type 2 diabetes mellitus with other specified complication: Secondary | ICD-10-CM | POA: Diagnosis not present

## 2017-12-26 DIAGNOSIS — IMO0002 Reserved for concepts with insufficient information to code with codable children: Secondary | ICD-10-CM

## 2017-12-26 DIAGNOSIS — I10 Essential (primary) hypertension: Secondary | ICD-10-CM

## 2017-12-26 LAB — COMPREHENSIVE METABOLIC PANEL
ALK PHOS: 75 U/L (ref 39–117)
ALT: 34 U/L (ref 0–53)
AST: 18 U/L (ref 0–37)
Albumin: 4 g/dL (ref 3.5–5.2)
BUN: 16 mg/dL (ref 6–23)
CHLORIDE: 102 meq/L (ref 96–112)
CO2: 31 mEq/L (ref 19–32)
Calcium: 9.5 mg/dL (ref 8.4–10.5)
Creatinine, Ser: 1.1 mg/dL (ref 0.40–1.50)
GFR: 72.88 mL/min (ref >60.00)
GLUCOSE: 188 mg/dL — AB (ref 70–99)
POTASSIUM: 3.8 meq/L (ref 3.5–5.1)
Sodium: 140 mEq/L (ref 135–145)
TOTAL PROTEIN: 7.2 g/dL (ref 6.0–8.3)
Total Bilirubin: 0.9 mg/dL (ref 0.2–1.2)

## 2017-12-26 LAB — LIPID PANEL
CHOLESTEROL: 232 mg/dL — AB (ref 0–200)
HDL: 41.7 mg/dL (ref >39.00)
LDL Cholesterol: 168 mg/dL — ABNORMAL HIGH (ref 0–99)
NonHDL: 190.43
TRIGLYCERIDES: 114 mg/dL (ref 0.0–149.0)
Total CHOL/HDL Ratio: 6
VLDL: 22.8 mg/dL (ref 0.0–40.0)

## 2017-12-26 LAB — HEMOGLOBIN A1C: Hgb A1c MFr Bld: 9.1 % — ABNORMAL HIGH (ref 4.6–6.5)

## 2017-12-26 MED ORDER — SITAGLIPTIN PHOSPHATE 25 MG PO TABS: 25.0000 mg | ORAL_TABLET | Freq: Every day | ORAL | 0 refills | Status: DC

## 2017-12-26 NOTE — Assessment & Plan Note (Signed)
Remains untreated  due to statin intolerance. He did not tolerate atorvastatin secondary to rash and nausea.  Lab Results  Component Value Date   CHOL 232 (H) 12/26/2017   HDL 41.70 12/26/2017   LDLCALC 168 (H) 12/26/2017   LDLDIRECT 214.0 08/05/2016   TRIG 114.0 12/26/2017   CHOLHDL 6 12/26/2017

## 2017-12-26 NOTE — Progress Notes (Signed)
Subjective:  Patient ID: George Gordon, male    DOB: 10/17/1960  Age: 57 y.o. MRN: [id number]  CC: Diagnoses of Hyperlipidemia due to type 2 diabetes mellitus ([place]), Diabetes mellitus type 2, uncontrolled, with complications ([place]), and Essential hypertension were pertinent to this visit.  HPI George Gordon presents for 3 month follow up on diabetes.  Patient has no complaints today.  Patient is not following a low glycemic index diet very closely, and never checked his mychart messages afer his last visit 3 months ago , so he never started taking JANUVIA.    He has been taking glipizide and his fasting sugars have been averaging 160 to 180  most of the time and post prandials have been under 180 except on rare occasions. Patient is not exercising due to increased responsibilities balancing a full time job and sharing the care of an infant son with his wife, who also works full time.  Patient has had an eye exam in the last 12 months and checks feet regularly for signs of infection.  Patient does not walk barefoot outside,  And denies any numbness tingling or burning in feet. Patient is up to date on all recommended vaccinations   Hypertension: patient checks blood pressure twice weekly at home.  Readings have been for the most part < 140/80 at rest . Patient is following a reduced salt diet most days and is taking medications as prescribed.    Patient reports that he had a " STOMACH ACHE"  two weeks ago [place] , was not accompanied by fevers,  nausea, hematuria, or change in stools. Symptoms resolved spontaneously.  .  Outpatient Medications Prior to Visit  Medication Sig Dispense Refill  . aspirin EC 81 MG tablet Take 81 mg by mouth daily.    . Cetirizine HCl (ZYRTEC ALLERGY) 10 MG CAPS Take 1 capsule by mouth daily.    . folic acid (FOLVITE) 720 MCG tablet Take 400 mcg by mouth daily. Reported on 07/25/2015    . glipiZIDE (GLUCOTROL) 10 MG tablet Take 1 tablet (10 mg total) by  mouth 2 (two) times daily before a meal. 180 tablet 0  . glucose blood test strip Please dispense Contour Next test strips to check Blood glucose twice a day, (90 day supply) E11.9 200 each 3  . ketorolac (ACULAR) 0.5 % ophthalmic solution   1  . prednisoLONE acetate (PRED FORTE) 1 % ophthalmic suspension   0  . vitamin C (ASCORBIC ACID) 500 MG tablet Take 500 mg by mouth daily. Reported on 07/25/2015    . vitamin E 400 UNIT capsule Take 400 Units by mouth daily.    . Cetirizine HCl (ZYRTEC ALLERGY) 10 MG CAPS One tablet daily as needed for seasonal allergies (Patient not taking: Reported on 12/26/2017) 30 capsule 5  . losartan-hydrochlorothiazide (HYZAAR) 100-12.5 MG tablet Take 1 tablet by mouth daily. (Patient not taking: Reported on 12/26/2017) 30 tablet 0  . losartan-hydrochlorothiazide (HYZAAR) 50-12.5 MG tablet Take 1 tablet by mouth daily. (Patient not taking: Reported on 12/26/2017) 90 tablet 3  . sitaGLIPtin (JANUVIA) 25 MG tablet Take 1 tablet (25 mg total) by mouth daily. (Patient not taking: Reported on 12/26/2017) 30 tablet 0   No facility-administered medications prior to visit.     Review of Systems;  Patient denies headache, fevers, malaise, unintentional weight loss, skin rash, eye pain, sinus congestion and sinus pain, sore throat, dysphagia,  hemoptysis , cough, dyspnea, wheezing, chest pain, palpitations, orthopnea, edema, abdominal pain, nausea,  melena, diarrhea, constipation, flank pain, dysuria, hematuria, urinary  Frequency, nocturia, numbness, tingling, seizures,  Focal weakness, Loss of consciousness,  Tremor, insomnia, depression, anxiety, and suicidal ideation.      Objective:  BP (!) 148/82 (BP Location: Left Arm, Patient Position: Sitting, Cuff Size: Normal)   Pulse 77   Temp 98 F (36.7 C) (Oral)   Resp 14   Ht 5\' 8"  (1.727 m)   Wt 159 lb 3.2 oz (72.2 kg)   SpO2 96%   BMI 24.21 kg/m   BP Readings from Last 3 Encounters:  12/26/17 (!) 148/82  09/21/17  140/66  05/27/17 (!) 144/78    Wt Readings from Last 3 Encounters:  12/26/17 159 lb 3.2 oz (72.2 kg)  09/21/17 163 lb 6.4 oz (74.1 kg)  05/27/17 160 lb 6.4 oz (72.8 kg)    General appearance: alert, cooperative and appears stated age Ears: normal TM's and external ear canals both ears Throat: lips, mucosa, and tongue normal; teeth and gums normal Neck: no adenopathy, no carotid bruit, supple, symmetrical, trachea midline and thyroid not enlarged, symmetric, no tenderness/mass/nodules Back: symmetric, no curvature. ROM normal. No CVA tenderness. Lungs: clear to auscultation bilaterally Heart: regular rate and rhythm, S1, S2 normal, no murmur, click, rub or gallop Abdomen: soft, non-tender; bowel sounds normal; no masses,  no organomegaly Pulses: 2+ and symmetric Skin: Skin color, texture, turgor normal. No rashes or lesions Lymph nodes: Cervical, supraclavicular, and axillary nodes normal.  Lab Results  Component Value Date   HGBA1C 9.1 (H) 12/26/2017   HGBA1C 8.2 (H) 09/21/2017   HGBA1C 8.6 (H) 05/27/2017    Lab Results  Component Value Date   CREATININE 1.10 12/26/2017   CREATININE 1.05 09/21/2017   CREATININE 1.26 05/27/2017    Lab Results  Component Value Date   WBC 7.7 05/27/2017   HGB 16.3 05/27/2017   HCT 49.1 05/27/2017   PLT 174 05/27/2017   GLUCOSE 188 (H) 12/26/2017   CHOL 232 (H) 12/26/2017   TRIG 114.0 12/26/2017   HDL 41.70 12/26/2017   LDLDIRECT 214.0 08/05/2016   LDLCALC 168 (H) 12/26/2017   ALT 34 12/26/2017   AST 18 12/26/2017   NA 140 12/26/2017   K 3.8 12/26/2017   CL 102 12/26/2017   CREATININE 1.10 12/26/2017   BUN 16 12/26/2017   CO2 31 12/26/2017   TSH 0.84 09/21/2017   HGBA1C 9.1 (H) 12/26/2017   MICROALBUR 0.9 09/21/2017    No results found.  Assessment & Plan:   Problem List Items Addressed This Visit    Hyperlipidemia due to type 2 diabetes mellitus ([place])     Remains untreated  due to statin intolerance. He did not tolerate  atorvastatin secondary to rash and nausea.  Lab Results  Component Value Date   CHOL 232 (H) 12/26/2017   HDL 41.70 12/26/2017   LDLCALC 168 (H) 12/26/2017   LDLDIRECT 214.0 08/05/2016   TRIG 114.0 12/26/2017   CHOLHDL 6 12/26/2017           Relevant Medications   sitaGLIPtin (JANUVIA) 25 MG tablet   losartan-hydrochlorothiazide (HYZAAR) 50-12.5 MG tablet   Diabetes mellitus type 2, uncontrolled, with complications ([place])    Recommend starting  Januvia for  greater control.  25 mg daily as initial dose. Continue glipizide. Low GI diet reviewed. . Recommend exercise   Lab Results  Component Value Date   HGBA1C 9.1 (H) 12/26/2017    Lab Results  Component Value Date   MICROALBUR 0.9 09/21/2017  Relevant Medications   sitaGLIPtin (JANUVIA) 25 MG tablet   losartan-hydrochlorothiazide (HYZAAR) 50-12.5 MG tablet   Essential hypertension     he reports that he has stopped bp medications because home readings have been < 140/80 but not at goal og 120/70.  Advised to resume lower dose of losartan /hct  Lab Results  Component Value Date   CREATININE 1.10 12/26/2017   Lab Results  Component Value Date   NA 140 12/26/2017   K 3.8 12/26/2017   CL 102 12/26/2017   CO2 31 12/26/2017         Relevant Medications   losartan-hydrochlorothiazide (HYZAAR) 50-12.5 MG tablet     A total of 25 minutes of face to face time was spent with patient more than half of which was spent in counselling about  His diabetes management and coordination of care  I am having [name] maintain his vitamin C, folic acid, aspirin EC, vitamin E, ketorolac, prednisoLONE acetate, glipiZIDE, glucose blood, Cetirizine HCl, sitaGLIPtin, and losartan-hydrochlorothiazide.  Meds ordered this encounter  Medications  . sitaGLIPtin (JANUVIA) 25 MG tablet    Sig: Take 1 tablet (25 mg total) by mouth daily.    Dispense:  30 tablet    Refill:  0  . losartan-hydrochlorothiazide (HYZAAR) 50-12.5  MG tablet    Sig: Take 1 tablet by mouth daily.    Dispense:  90 tablet    Refill:  3    Medications Discontinued During This Encounter  Medication Reason  . Cetirizine HCl (ZYRTEC ALLERGY) 10 MG CAPS Patient has not taken in last 30 days  . losartan-hydrochlorothiazide (HYZAAR) 100-12.5 MG tablet Patient has not taken in last 30 days  . losartan-hydrochlorothiazide (HYZAAR) 50-12.5 MG tablet Patient has not taken in last 30 days  . sitaGLIPtin (JANUVIA) 25 MG tablet Reorder    Follow-up: Return in about 3 months (around 03/28/2018) for follow up diabetes.   Crecencio Mc, MD

## 2017-12-26 NOTE — Assessment & Plan Note (Addendum)
he reports that he has stopped bp medications because home readings have been < 140/80 but not at goal og 120/70.  Advised to resume lower dose of losartan /hct  Lab Results  Component Value Date   CREATININE 1.10 12/26/2017   Lab Results  Component Value Date   NA 140 12/26/2017   K 3.8 12/26/2017   CL 102 12/26/2017   CO2 31 12/26/2017

## 2017-12-26 NOTE — Assessment & Plan Note (Addendum)
Recommend starting  Januvia for  greater control.  25 mg daily as initial dose. Continue glipizide. Low GI diet reviewed. . Recommend exercise   Lab Results  Component Value Date   HGBA1C 9.1 (H) 12/26/2017    Lab Results  Component Value Date   MICROALBUR 0.9 09/21/2017

## 2017-12-26 NOTE — Patient Instructions (Addendum)
You are going to need to add a second medication to your  diabetes regimen  called Januvia.  The startign dose is 25 mg daiy or the first month,  The following month the dose will  Be 50 mg daily   Staying on track is easier when you have healthy snacks to grab at home    Low carb ice cream  Has become more popular and there are many more choices :  [employer]er's Carb Smart ice cream bars and scoopable   E enlightenment  Skinny Cow  Weight watchers    Keep a contained or steamed vegetables (broccoli, cauliflower , brussels sprouts,  Season with ginger root and onion)  To munch on   Exercise is VERY IMPORTANT .  [place]

## 2017-12-27 MED ORDER — LOSARTAN POTASSIUM-HCTZ 50-12.5 MG PO TABS: 1.0000 | ORAL_TABLET | Freq: Every day | ORAL | 3 refills | Status: DC
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
## 2018-01-16 DIAGNOSIS — H35352 Cystoid macular degeneration, left eye: Secondary | ICD-10-CM | POA: Diagnosis not present

## 2018-02-20 ENCOUNTER — Other Ambulatory Visit: Payer: Self-pay | Admitting: Internal Medicine

## 2018-02-28 ENCOUNTER — Other Ambulatory Visit: Payer: Self-pay

## 2018-02-28 ENCOUNTER — Encounter: Payer: Self-pay | Admitting: Family Medicine

## 2018-02-28 ENCOUNTER — Ambulatory Visit: Payer: BLUE CROSS/BLUE SHIELD | Admitting: Family Medicine

## 2018-02-28 DIAGNOSIS — B9789 Other viral agents as the cause of diseases classified elsewhere: Secondary | ICD-10-CM

## 2018-02-28 DIAGNOSIS — J069 Acute upper respiratory infection, unspecified: Secondary | ICD-10-CM

## 2018-02-28 NOTE — Assessment & Plan Note (Signed)
Symptomatic care 

## 2018-02-28 NOTE — Patient Instructions (Signed)
Mucinex DM twice daily for cough and congestion.  Ibuprofen 600-800 mg three times daily for sore throat.  Push fluids. Rest.

## 2018-02-28 NOTE — Progress Notes (Signed)
   Subjective:    Patient ID: George Gordon, male    DOB: 03/15/1961, 57 y.o.   MRN: [id number]  Cough  This is a new problem. The current episode started yesterday. The problem has been gradually worsening. The cough is productive of sputum. Associated symptoms include headaches, nasal congestion and a sore throat. Pertinent negatives include no chills, ear congestion, ear pain, fever, myalgias, shortness of breath or wheezing. The symptoms are aggravated by lying down. Risk factors:  son sick with cough, nonsmoker. Treatments tried: lemon water. The treatment provided mild relief. There is no history of asthma, COPD or environmental allergies.  DM, poor control  Sore Throat   Associated symptoms include coughing and headaches. Pertinent negatives include no ear pain or shortness of breath.   Lab Results  Component Value Date   HGBA1C 9.1 (H) 12/26/2017   No antibiotics in last month.   Review of Systems  Constitutional: Negative for chills and fever.  HENT: Positive for sore throat. Negative for ear pain.   Respiratory: Positive for cough. Negative for shortness of breath and wheezing.   Musculoskeletal: Negative for myalgias.  Allergic/Immunologic: Negative for environmental allergies.  Neurological: Positive for headaches.  Blood pressure 110/62, pulse 71, temperature 98.3 F (36.8 C), temperature source Oral, height 5\' 8"  (1.727 m), weight 155 lb (70.3 kg), SpO2 96 %.      Objective:   Physical Exam  Constitutional: Vital signs are normal. He appears well-developed and well-nourished.  Non-toxic appearance. He does not appear ill. No distress.  HENT:  Head: Normocephalic and atraumatic.  Right Ear: Hearing, tympanic membrane, external ear and ear canal normal. No tenderness. No foreign bodies. Tympanic membrane is not retracted and not bulging.  Left Ear: Hearing, tympanic membrane, external ear and ear canal normal. No tenderness. No foreign bodies. Tympanic membrane is not  retracted and not bulging.  Nose: Nose normal. No mucosal edema or rhinorrhea. Right sinus exhibits no maxillary sinus tenderness and no frontal sinus tenderness. Left sinus exhibits no maxillary sinus tenderness and no frontal sinus tenderness.  Mouth/Throat: Uvula is midline, oropharynx is clear and moist and mucous membranes are normal. Normal dentition. No dental caries. No oropharyngeal exudate or tonsillar abscesses.  Eyes: Pupils are equal, round, and reactive to light. Conjunctivae, EOM and lids are normal. Lids are everted and swept, no foreign bodies found.  Neck: Trachea normal, normal range of motion and phonation normal. Neck supple. Carotid bruit is not present. No thyroid mass and no thyromegaly present.  Cardiovascular: Normal rate, regular rhythm, S1 normal, S2 normal, normal heart sounds, intact distal pulses and normal pulses. Exam reveals no gallop.  No murmur heard. Pulmonary/Chest: Effort normal and breath sounds normal. No respiratory distress. He has no wheezes. He has no rhonchi. He has no rales.  Abdominal: Soft. Normal appearance and bowel sounds are normal. There is no hepatosplenomegaly. There is no tenderness. There is no rebound, no guarding and no CVA tenderness. No hernia.  Neurological: He is alert. He has normal reflexes.  Skin: Skin is warm, dry and intact. No rash noted.  Psychiatric: He has a normal mood and affect. His speech is normal and behavior is normal. Judgment normal.          Assessment & Plan:

## 2018-03-02 ENCOUNTER — Ambulatory Visit: Payer: BLUE CROSS/BLUE SHIELD | Admitting: Family Medicine

## 2018-03-02 ENCOUNTER — Ambulatory Visit: Payer: Self-pay | Admitting: *Deleted

## 2018-03-02 ENCOUNTER — Encounter: Payer: Self-pay | Admitting: Family Medicine

## 2018-03-02 VITALS — BP 130/76 | HR 78 | Temp 98.6°F | Resp 16 | Wt 157.1 lb

## 2018-03-02 DIAGNOSIS — R0982 Postnasal drip: Secondary | ICD-10-CM | POA: Diagnosis not present

## 2018-03-02 DIAGNOSIS — J0100 Acute maxillary sinusitis, unspecified: Secondary | ICD-10-CM

## 2018-03-02 DIAGNOSIS — R05 Cough: Secondary | ICD-10-CM | POA: Diagnosis not present

## 2018-03-02 DIAGNOSIS — R059 Cough, unspecified: Secondary | ICD-10-CM

## 2018-03-02 MED ORDER — AMOXICILLIN-POT CLAVULANATE 875-125 MG PO TABS: 1.0000 | ORAL_TABLET | Freq: Two times a day (BID) | ORAL | 0 refills | Status: DC

## 2018-03-02 NOTE — Telephone Encounter (Signed)
Patient was seen on 02/28/18 by Dr. Diona Browner Dx viral URI told to use mucinex and advil. States is feels worse today. Headache, sinus pressure and severe congestion with occasional thick discharge. Chest feels tight with breathing but no wheeze. Nonproductive cough while on mucinex stated he is drinking lots of water with the mucinex. Cough and sore throat keeping him up at night. Denies any fever. Today is day 4 of symptoms. He is home from work due to illness. Reported his wife had same symptoms, prescribed antibiotic and she recovered quickly. They have a ?36month old in daycare who comes home with symptoms. Advice care discussed re OTC symptom relief medications. No availability with PCP. Appointment made for this afternoon.   Reason for Disposition . [1] Using nasal washes and pain medicine > 24 hours AND [2] sinus pain (lower forehead, cheekbone, or eye) persists  Answer Assessment - Initial Assessment Questions 1. ONSET: "When did the nasal discharge start?"      Nose congestion, cough 2. AMOUNT: "How much discharge is there?"      None, stuffy nose and non productive cough 3. COUGH: "Do you have a cough?" If yes, ask: "Describe the color of your sputum" (clear, white, yellow, green)    none 4. RESPIRATORY DISTRESS: "Describe your breathing."      Having to breathe through the mouth. 5. FEVER: "Do you have a fever?" If so, ask: "What is your temperature, how was it measured, and when did it start?"    no 6. SEVERITY: "Overall, how bad are you feeling right now?" (e.g., doesn't interfere with normal activities, staying home from school/work, staying in bed)      Bad, home work. 7. OTHER SYMPTOMS: "Do you have any other symptoms?" (e.g., sore throat, earache, wheezing, vomiting)    Sore throat. Using warm salt gargles 8. PREGNANCY: "Is there any chance you are pregnant?" "When was your last menstrual period?"     no  Protocols used: COMMON COLD-A-AH

## 2018-03-02 NOTE — Patient Instructions (Signed)
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
## 2018-03-16 ENCOUNTER — Other Ambulatory Visit: Payer: Self-pay | Admitting: Internal Medicine

## 2018-03-23 ENCOUNTER — Telehealth: Payer: Self-pay | Admitting: Radiology

## 2018-03-23 DIAGNOSIS — E118 Type 2 diabetes mellitus with unspecified complications: Principal | ICD-10-CM

## 2018-03-23 DIAGNOSIS — E1165 Type 2 diabetes mellitus with hyperglycemia: Secondary | ICD-10-CM

## 2018-03-23 DIAGNOSIS — IMO0002 Reserved for concepts with insufficient information to code with codable children: Secondary | ICD-10-CM

## 2018-03-23 NOTE — Addendum Note (Signed)
Addended by: Crecencio Mc on: 03/23/2018 04:28 PM   Modules accepted: Orders

## 2018-03-23 NOTE — Telephone Encounter (Signed)
Pt coming in for labs Tuesday, please place future orders. Thank you.

## 2018-03-23 NOTE — Telephone Encounter (Signed)
Fasting labs ordered.  Thanks.

## 2018-03-28 ENCOUNTER — Other Ambulatory Visit (INDEPENDENT_AMBULATORY_CARE_PROVIDER_SITE_OTHER): Payer: BLUE CROSS/BLUE SHIELD

## 2018-03-28 DIAGNOSIS — E1165 Type 2 diabetes mellitus with hyperglycemia: Secondary | ICD-10-CM

## 2018-03-28 DIAGNOSIS — E118 Type 2 diabetes mellitus with unspecified complications: Secondary | ICD-10-CM | POA: Diagnosis not present

## 2018-03-28 DIAGNOSIS — IMO0002 Reserved for concepts with insufficient information to code with codable children: Secondary | ICD-10-CM

## 2018-03-28 LAB — COMPREHENSIVE METABOLIC PANEL
ALT: 25 U/L (ref 0–53)
AST: 17 U/L (ref 0–37)
Albumin: 3.8 g/dL (ref 3.5–5.2)
Alkaline Phosphatase: 78 U/L (ref 39–117)
BUN: 19 mg/dL (ref 6–23)
CHLORIDE: 104 meq/L (ref 96–112)
CO2: 29 meq/L (ref 19–32)
CREATININE: 1.02 mg/dL (ref 0.40–1.50)
Calcium: 9.1 mg/dL (ref 8.4–10.5)
GFR: 79.45 mL/min (ref >60.00)
Glucose, Bld: 169 mg/dL — ABNORMAL HIGH (ref 70–99)
Potassium: 4.2 mEq/L (ref 3.5–5.1)
SODIUM: 141 meq/L (ref 135–145)
Total Bilirubin: 0.9 mg/dL (ref 0.2–1.2)
Total Protein: 6.7 g/dL (ref 6.0–8.3)

## 2018-03-28 LAB — LIPID PANEL
Cholesterol: 208 mg/dL — ABNORMAL HIGH (ref 0–200)
HDL: 36.1 mg/dL — ABNORMAL LOW (ref >39.00)
LDL CALC: 149 mg/dL — AB (ref 0–99)
NONHDL: 171.65
Total CHOL/HDL Ratio: 6
Triglycerides: 114 mg/dL (ref 0.0–149.0)
VLDL: 22.8 mg/dL (ref 0.0–40.0)

## 2018-03-28 LAB — HEMOGLOBIN A1C: Hgb A1c MFr Bld: 10.1 % — ABNORMAL HIGH (ref 4.6–6.5)

## 2018-03-30 ENCOUNTER — Encounter: Payer: Self-pay | Admitting: Internal Medicine

## 2018-03-30 ENCOUNTER — Ambulatory Visit: Payer: BLUE CROSS/BLUE SHIELD | Admitting: Internal Medicine

## 2018-03-30 DIAGNOSIS — I10 Essential (primary) hypertension: Secondary | ICD-10-CM | POA: Diagnosis not present

## 2018-03-30 DIAGNOSIS — E1165 Type 2 diabetes mellitus with hyperglycemia: Secondary | ICD-10-CM | POA: Diagnosis not present

## 2018-03-30 DIAGNOSIS — E118 Type 2 diabetes mellitus with unspecified complications: Secondary | ICD-10-CM

## 2018-03-30 DIAGNOSIS — IMO0002 Reserved for concepts with insufficient information to code with codable children: Secondary | ICD-10-CM

## 2018-03-30 MED ORDER — SITAGLIPTIN PHOSPHATE 50 MG PO TABS: 50.0000 mg | ORAL_TABLET | Freq: Every day | ORAL | Status: DC

## 2018-03-30 NOTE — Patient Instructions (Addendum)
Your diabetes is not controlled at all currently  I am increasing the Januvia  Dose  to 50 mg daily.  Continue glipizide  8:00 appt with George Gordon ,  Our Pharm D next Monday  Check sugars at least twice daily until then and bring log of blood sugars to your appt with George Gordon    Try the SOLA low carb bread ([employer]  Frozen bread )  3 g/slice  Tastes great!

## 2018-03-30 NOTE — Progress Notes (Signed)
Subjective:  Patient ID: George Gordon, male    DOB: 28-Feb-1961  Age: 57 y.o. MRN: [id number]  CC: Diagnoses of Diabetes mellitus type 2, uncontrolled, with complications ([place]) and Essential hypertension were pertinent to this visit.  HPI George Gordon presents for 3 month follow up on uncontrolled diabetes.  Patient has no complaints today.  Patient is not following a low glycemic index diet and taking all prescribed medications regularly without side effects.  Fasting sugars have been over 150  most of the time and  h does not post prandials have been under 160 except on rare occasions. Patient is exercising about 1 time per week and not  intentionally trying to lose weight .  Patient has had an eye exam in the last 12 months and checks feet regularly for signs of infection.  Patient does not walk barefoot outside,  And denies an numbness tingling or burning in feet. Patient is up to date on all recommended vaccinations.   Not follow a low GI diet . Has a  4 month old. Son who has been fussy at night and he has been junk food eating.   HOME READINGS 623 to 762 systolic w ththout medication   Eye exam June 13 swelling had resolved  On ketorolac eye drops   Outpatient Medications Prior to Visit  Medication Sig Dispense Refill  . Cetirizine HCl (ZYRTEC ALLERGY) 10 MG CAPS Take 1 capsule by mouth daily.    . folic acid (FOLVITE) 831 MCG tablet Take 400 mcg by mouth daily. Reported on 07/25/2015    . glipiZIDE (GLUCOTROL) 10 MG tablet TAKE 1 TABLET BY MOUTH TWICE DAILY BEFORE A MEAL 180 tablet 1  . glucose blood test strip Please dispense Contour Next test strips to check Blood glucose twice a day, (90 day supply) E11.9 200 each 3  . ketorolac (ACULAR) 0.5 % ophthalmic solution   1  . prednisoLONE acetate (PRED FORTE) 1 % ophthalmic suspension   0  . vitamin C (ASCORBIC ACID) 500 MG tablet Take 500 mg by mouth daily. Reported on 07/25/2015    . vitamin E 400 UNIT capsule Take 400 Units by  mouth daily.    Marland Kitchen JANUVIA 25 MG tablet TAKE ONE TABLET BY MOUTH EVERY DAY 90 tablet 1  . losartan-hydrochlorothiazide (HYZAAR) 50-12.5 MG tablet Take 1 tablet by mouth daily. (Patient not taking: Reported on 03/30/2018) 90 tablet 3  . amoxicillin-clavulanate (AUGMENTIN) 875-125 MG tablet Take 1 tablet by mouth 2 (two) times daily. (Patient not taking: Reported on 03/30/2018) 20 tablet 0  . sitaGLIPtin (JANUVIA) 25 MG tablet Take 1 tablet (25 mg total) by mouth daily. (Patient not taking: Reported on 02/28/2018) 30 tablet 0   No facility-administered medications prior to visit.     Review of Systems;  Patient denies headache, fevers, malaise, unintentional weight loss, skin rash, eye pain, sinus congestion and sinus pain, sore throat, dysphagia,  hemoptysis , cough, dyspnea, wheezing, chest pain, palpitations, orthopnea, edema, abdominal pain, nausea, melena, diarrhea, constipation, flank pain, dysuria, hematuria, urinary  Frequency, nocturia, numbness, tingling, seizures,  Focal weakness, Loss of consciousness,  Tremor, insomnia, depression, anxiety, and suicidal ideation.      Objective:  BP 128/82 (BP Location: Left Arm, Patient Position: Sitting, Cuff Size: Normal)   Pulse 62   Temp 98.3 F (36.8 C) (Oral)   Resp 14   Ht 5\' 8"  (1.727 m)   Wt 159 lb 9.6 oz (72.4 kg)   SpO2 94%   BMI 24.27  kg/m   BP Readings from Last 3 Encounters:  03/30/18 128/82  03/02/18 130/76  02/28/18 110/62    Wt Readings from Last 3 Encounters:  03/30/18 159 lb 9.6 oz (72.4 kg)  03/02/18 157 lb 2 oz (71.3 kg)  02/28/18 155 lb (70.3 kg)    General appearance: alert, cooperative and appears stated age Ears: normal TM's and external ear canals both ears Throat: lips, mucosa, and tongue normal; teeth and gums normal Neck: no adenopathy, no carotid bruit, supple, symmetrical, trachea midline and thyroid not enlarged, symmetric, no tenderness/mass/nodules Back: symmetric, no curvature. ROM normal. No CVA  tenderness. Lungs: clear to auscultation bilaterally Heart: regular rate and rhythm, S1, S2 normal, no murmur, click, rub or gallop Abdomen: soft, non-tender; bowel sounds normal; no masses,  no organomegaly Pulses: 2+ and symmetric Skin: Skin color, texture, turgor normal. No rashes or lesions Lymph nodes: Cervical, supraclavicular, and axillary nodes normal.  Lab Results  Component Value Date   HGBA1C 10.1 (H) 03/28/2018   HGBA1C 9.1 (H) 12/26/2017   HGBA1C 8.2 (H) 09/21/2017    Lab Results  Component Value Date   CREATININE 1.02 03/28/2018   CREATININE 1.10 12/26/2017   CREATININE 1.05 09/21/2017    Lab Results  Component Value Date   WBC 7.7 05/27/2017   HGB 16.3 05/27/2017   HCT 49.1 05/27/2017   PLT 174 05/27/2017   GLUCOSE 169 (H) 03/28/2018   CHOL 208 (H) 03/28/2018   TRIG 114.0 03/28/2018   HDL 36.10 (L) 03/28/2018   LDLDIRECT 214.0 08/05/2016   LDLCALC 149 (H) 03/28/2018   ALT 25 03/28/2018   AST 17 03/28/2018   NA 141 03/28/2018   K 4.2 03/28/2018   CL 104 03/28/2018   CREATININE 1.02 03/28/2018   BUN 19 03/28/2018   CO2 29 03/28/2018   TSH 0.84 09/21/2017   HGBA1C 10.1 (H) 03/28/2018   MICROALBUR 0.9 09/21/2017    No results found.  Assessment & Plan:   Problem List Items Addressed This Visit    Diabetes mellitus type 2, uncontrolled, with complications ([place])    Worsening control  Despite starting Januvia   25 mg daily as initial dose.Will increase dose to 50 mg daily.   Continue glipizide. ,   He is aware that he will have to start taking insulin. Referral to Eula Fried for initiation of insulin /analogues.  Diet discussed I detail today A total of 25 minutes of face to face time was spent with patient more than half of which was spent in counselling about the above mentioned conditions  and coordination of care    Lab Results  Component Value Date   HGBA1C 10.1 (H) 03/28/2018    Lab Results  Component Value Date   MICROALBUR 0.9 09/21/2017           Relevant Medications   sitaGLIPtin (JANUVIA) 50 MG tablet   Essential hypertension    Well controlled on current regimen. Renal function stable, no changes today.  Lab Results  Component Value Date   CREATININE 1.02 03/28/2018   Lab Results  Component Value Date   NA 141 03/28/2018   K 4.2 03/28/2018   CL 104 03/28/2018   CO2 29 03/28/2018           A total of 25 minutes of face to face time was spent with patient more than half of which was spent in counselling about the above mentioned conditions  and coordination of care   I have discontinued [employer]  sitaGLIPtin and amoxicillin-clavulanate. I have also changed his JANUVIA to sitaGLIPtin. Additionally, I am having him maintain his vitamin C, folic acid, vitamin E, ketorolac, prednisoLONE acetate, glucose blood, Cetirizine HCl, losartan-hydrochlorothiazide, and glipiZIDE.  Meds ordered this encounter  Medications  . sitaGLIPtin (JANUVIA) 50 MG tablet    Sig: Take 1 tablet (50 mg total) by mouth daily.    FUTURE FILLS    Medications Discontinued During This Encounter  Medication Reason  . amoxicillin-clavulanate (AUGMENTIN) 875-125 MG tablet Completed Course  . sitaGLIPtin (JANUVIA) 25 MG tablet Duplicate  . JANUVIA 25 MG tablet     Follow-up: Return in about 3 months (around 06/29/2018) for follow up diabetes.   Crecencio Mc, MD

## 2018-04-01 NOTE — Assessment & Plan Note (Addendum)
Worsening control  Despite starting Januvia   25 mg daily as initial dose.Will increase dose to 50 mg daily.   Continue glipizide. ,   He is aware that he will have to start taking insulin. Referral to Eula Fried for initiation of insulin /analogues.  Diet discussed I detail today A total of 25 minutes of face to face time was spent with patient more than half of which was spent in counselling about the above mentioned conditions  and coordination of care    Lab Results  Component Value Date   HGBA1C 10.1 (H) 03/28/2018    Lab Results  Component Value Date   MICROALBUR 0.9 09/21/2017

## 2018-04-01 NOTE — Assessment & Plan Note (Signed)
Well controlled on current regimen. Renal function stable, no changes today.  Lab Results  Component Value Date   CREATININE 1.02 03/28/2018   Lab Results  Component Value Date   NA 141 03/28/2018   K 4.2 03/28/2018   CL 104 03/28/2018   CO2 29 03/28/2018

## 2018-04-03 ENCOUNTER — Ambulatory Visit (INDEPENDENT_AMBULATORY_CARE_PROVIDER_SITE_OTHER): Payer: BLUE CROSS/BLUE SHIELD | Admitting: Pharmacist

## 2018-04-03 ENCOUNTER — Encounter: Payer: Self-pay | Admitting: Pharmacist

## 2018-04-03 VITALS — BP 121/77 | Wt 158.2 lb

## 2018-04-03 DIAGNOSIS — E1165 Type 2 diabetes mellitus with hyperglycemia: Secondary | ICD-10-CM

## 2018-04-03 DIAGNOSIS — E118 Type 2 diabetes mellitus with unspecified complications: Secondary | ICD-10-CM

## 2018-04-03 DIAGNOSIS — I10 Essential (primary) hypertension: Secondary | ICD-10-CM | POA: Diagnosis not present

## 2018-04-03 DIAGNOSIS — E785 Hyperlipidemia, unspecified: Secondary | ICD-10-CM

## 2018-04-03 DIAGNOSIS — E1169 Type 2 diabetes mellitus with other specified complication: Secondary | ICD-10-CM | POA: Diagnosis not present

## 2018-04-03 DIAGNOSIS — IMO0002 Reserved for concepts with insufficient information to code with codable children: Secondary | ICD-10-CM

## 2018-04-03 MED ORDER — EMPAGLIFLOZIN 10 MG PO TABS: 10.0000 mg | ORAL_TABLET | Freq: Every day | ORAL | 1 refills | Status: DC

## 2018-04-03 MED ORDER — GLIPIZIDE 10 MG PO TABS: ORAL_TABLET | ORAL | 1 refills | Status: DC

## 2018-04-03 NOTE — Assessment & Plan Note (Signed)
-   Currently uncontrolled, most recent A1c 10.1% on 03/28/2018, worsened from 9.1% on 12/26/2017; Goal A1c <7% Patient denies hypoglycemic events. Patient reports adherence with medication. He denies s/sx hyperglycemia; appropriate to try additional pharmacotherapy prior to insulin initiation.  - Start Jardiance 10 mg daily. Counseled on mechanism of action and side effects. - Decrease evening glipizide to 5 mg, continue 10 mg with breakfast. If SMBG <100 in the afternoons, decrease AM glipizide to 5 mg as well - Continue Januvia 50 mg daily - Moving forward, consider rechallenge of metformin therapy. History of rash after 2 years of tolerated treatment is not consistent with a typical medication allergy.

## 2018-04-03 NOTE — Assessment & Plan Note (Signed)
#  ASCVD risk - primary prevention in patient aged 59-75 with DM, baseline LDL 70-189, 10 year ASCVD risk >20%. High intensity statin indicated per 2019 ADA guidelines. Hx of rash, GI upset with atorvastatin therapy - Moving forward, consider rechallenge of alternative statin therapy

## 2018-04-03 NOTE — Progress Notes (Addendum)
S:     Chief Complaint  Patient presents with  . Medication Management    Diabetes   Patient arrives in good spirits, ambulating without assistance.  Presents for diabetes evaluation, education, and management at the request of Dr. Derrel Nip at their last appointment (03/30/2018). At that time, Januvia was increased from 25 to 50 mg daily.  Patient reports diabetes was diagnosed in ~2015 at a health fair at his work. He notes that he was originally prescribed metformin, and took this for ~2 years with no issues but then developed a rash. He notes that he generally eats healthy; he denies drinking sodas, sweet teas, or indulging in many sweets.   He notes that he checks SMBG daily, but forgot to bring readings to clinic today.  Family/Social History: Brother has diabetes   Human resources officer affordability: Pharmacologist  Patient reports adherence with medications.  Current diabetes medications include: glipizide 10 mg BID, Januvia 50 mg daily - Hx rash with metformin, though he notes he took for ~2 years with no issues Current hypertension medications include: losartan-HCTZ 50-12.5 mg daily  Patient denies recent hypoglycemic s/sx including dizziness, shakiness, sweating. Patient denies hyperglycemic symptoms including polyuria, polydipsia, polyphagia, nocturia, neuropathy, blurred vision, though notes that he had polydipsia, polyuria when he was first diagnosed   O:  Physical Exam  Constitutional: He appears well-developed and well-nourished.  Vitals reviewed.   Review of Systems  All other systems reviewed and are negative.   Lab Results  Component Value Date   HGBA1C 10.1 (H) 03/28/2018   Vitals:   04/03/18 0835  BP: 354/65    Basic Metabolic Panel BMP Latest Ref Rng & Units 03/28/2018 12/26/2017 09/21/2017  Glucose 70 - 99 mg/dL 169(H) 188(H) 117(H)  BUN 6 - 23 mg/dL 19 16 28(H)  Creatinine 0.40 - 1.50 mg/dL 1.02 1.10 1.05  BUN/Creat Ratio 6 - 22 (calc) -  - -  Sodium 135 - 145 mEq/L 141 140 140  Potassium 3.5 - 5.1 mEq/L 4.2 3.8 4.1  Chloride 96 - 112 mEq/L 104 102 103  CO2 19 - 32 mEq/L 29 31 32  Calcium 8.4 - 10.5 mg/dL 9.1 9.5 9.9     Lipid Panel     Component Value Date/Time   CHOL 208 (H) 03/28/2018 0805   TRIG 114.0 03/28/2018 0805   HDL 36.10 (L) 03/28/2018 0805   CHOLHDL 6 03/28/2018 0805   VLDL 22.8 03/28/2018 0805   LDLCALC 149 (H) 03/28/2018 0805   LDLDIRECT 214.0 08/05/2016 0936    Fasting SMBG: 130-170  2 hours after breakfast: remembers one reading of 164  Clinical ASCVD: No ;   ASCVD risk factors: age 59-75 10 year ASCVD risk: 23.2%   A/P: Following discussion and approval by Dr. Derrel Nip, the following medication changes were made:   #Diabetes - Currently uncontrolled, most recent A1c 10.1% on 03/28/2018, worsened from 9.1% on 12/26/2017; Goal A1c <7% Patient denies hypoglycemic events. Patient reports adherence with medication. He denies s/sx hyperglycemia; appropriate to try additional pharmacotherapy prior to insulin initiation.  - Start Jardiance 10 mg daily. Counseled on mechanism of action and side effects. - Decrease evening glipizide to 5 mg, continue 10 mg with breakfast. If SMBG <100 in the afternoons, decrease AM glipizide to 5 mg as well - Continue Januvia 50 mg daily - Moving forward, consider rechallenge of metformin therapy. History of rash after 2 years of tolerated treatment is not consistent with a typical medication allergy.   #Hypertension currently  controlled. Goal BP <130/80 Patient reports adherence with medication. - Continue losartan-HCTZ 50/12.5 mg daily. Monitor for hypotension with addition of SGLT2 therapy; if develops, consider discontinuation of HCTZ component.  #ASCVD risk - primary prevention in patient aged 39-75 with DM, baseline LDL 70-189, 10 year ASCVD risk >20%. High intensity statin indicated per 2019 ADA guidelines. Hx of rash, GI upset with atorvastatin therapy - Moving  forward, consider rechallenge of alternative statin therapy   Written patient instructions provided.  Total time in face to face counseling 45 minutes.    Follow up in Pharmacist Clinic Visit 4 weeks.   De Hollingshead, PharmD PGY2 Ambulatory Care Pharmacy Resident Phone: [phone]   I have reviewed the above information and agree with above.   Deborra Medina, MD

## 2018-04-03 NOTE — Assessment & Plan Note (Signed)
#  Hypertension currently controlled. Goal BP <130/80 Patient reports adherence with medication. - Continue losartan-HCTZ 50/12.5 mg daily. Monitor for hypotension with addition of SGLT2 therapy; if develops, consider discontinuation of HCTZ component.

## 2018-04-03 NOTE — Patient Instructions (Addendum)
It was great to meet you today!   We are going to start a new medication called JARDIANCE (empagliflozin) 10 mg daily in the morning. Decrease glipizide to 10 mg with breakfast, 5 mg with supper. If you start to have feelings of low blood sugars in the afternoon, or see readings <100, you can decrease the morning glipizide to 5 mg as well. Continue Januvia.   Continue to check your blood sugars:  1) Fasting, before you eat breakfast. Goal: 70-130 mg/dL 2) 2 hours after the largest meal. Goal: <180 mg/dL   You can also go to the [employer] ([email or web address]) and sign up for a [employer]. This can be combined with your commercial ([employer]) insurance to decrease your copay.    Schedule follow up with me in ~ 4 weeks. Feel free to call with any questions.    Best,  Catie Darnelle Maffucci, PharmD

## 2018-04-17 DIAGNOSIS — H35352 Cystoid macular degeneration, left eye: Secondary | ICD-10-CM | POA: Diagnosis not present

## 2018-04-17 DIAGNOSIS — E113212 Type 2 diabetes mellitus with mild nonproliferative diabetic retinopathy with macular edema, left eye: Secondary | ICD-10-CM | POA: Diagnosis not present

## 2018-05-01 ENCOUNTER — Encounter: Payer: Self-pay | Admitting: Pharmacist

## 2018-05-01 ENCOUNTER — Ambulatory Visit (INDEPENDENT_AMBULATORY_CARE_PROVIDER_SITE_OTHER): Payer: BLUE CROSS/BLUE SHIELD | Admitting: Pharmacist

## 2018-05-01 DIAGNOSIS — I10 Essential (primary) hypertension: Secondary | ICD-10-CM

## 2018-05-01 DIAGNOSIS — E1169 Type 2 diabetes mellitus with other specified complication: Secondary | ICD-10-CM | POA: Diagnosis not present

## 2018-05-01 DIAGNOSIS — E118 Type 2 diabetes mellitus with unspecified complications: Secondary | ICD-10-CM

## 2018-05-01 DIAGNOSIS — E1165 Type 2 diabetes mellitus with hyperglycemia: Secondary | ICD-10-CM | POA: Diagnosis not present

## 2018-05-01 DIAGNOSIS — E785 Hyperlipidemia, unspecified: Secondary | ICD-10-CM

## 2018-05-01 DIAGNOSIS — IMO0002 Reserved for concepts with insufficient information to code with codable children: Secondary | ICD-10-CM

## 2018-05-01 MED ORDER — EMPAGLIFLOZIN 25 MG PO TABS: 25.0000 mg | ORAL_TABLET | Freq: Every day | ORAL | 2 refills | Status: DC

## 2018-05-01 MED ORDER — GLIPIZIDE 10 MG PO TABS: 5.0000 mg | ORAL_TABLET | Freq: Two times a day (BID) | ORAL | 1 refills | Status: DC

## 2018-05-01 NOTE — Assessment & Plan Note (Signed)
#  Hypertension currently controlled per home readings. Goal BP <130/80 Patient reports adherence with medication. - Continue losartan-HCTZ 50/12.5 mg daily. Counseled patient to contact clinic if SBP consistently <100. If so, recommend discontinuation of HCTZ component.

## 2018-05-01 NOTE — Assessment & Plan Note (Signed)
#  Diabetes - Currently uncontrolled though improved per SMBG results; most recent A1c 10.1% on 03/28/2018, worsened from 9.1% on 12/26/2017; Goal A1c <7% Patient reports hypoglycemic events.  - Increase Jardiance to 25 mg daily for maximal benefit. Patient counseled to take 2 tablets of his current supply until finished - Decrease glipizide to 5 mg twice daily. Patient counseled that he can discontinue AM glipizide if he experiences more afternoon hypoglycemia, and discontinue evening glipizide if experiencing overnight or early morning hypoglycemia. Low threshold for discontinuation of glipizide. - Continue Januvia 50 mg daily. Moving forward, consider combination SGLT2/DPP4 therapy to reduce pill burden, reduce copay burden. - Moving forward, can consider rechallenge of metformin therapy. History of rash after 2 years of tolerated treatment is not consistent with a typical medication allergy.

## 2018-05-01 NOTE — Progress Notes (Addendum)
S:     Chief Complaint  Patient presents with  . Medication Management    Diabetes   Patient arrives in good spirits, ambulating without assistance.  Presents for diabetes evaluation, education, and management at the request of Dr. Derrel Nip at their last appointment (03/30/2018). At that time, Januvia was increased from 25 to 50 mg daily. Last seen by Pharmacy on 04/03/2018 - at that time, Vania Rea was initiated and patient told to decrease glipizide.   Today, he reports excellent improvement with BG since last appointment. He denies any genitourinary complaints since starting Jardiance. He has been checking fasting BG and pre-supper readings most days, and the occasional 2 hour post supper reading. He notes some episodes of hypoglycemia pre supper, and describes feeling more tired in the afternoon. He did not decrease glipizide, and has still been taking 10 mg BID. He has also been checking BP occasionally.   Patient reports diabetes was diagnosed in ~2015 at a health fair at his work. He notes that he was originally prescribed metformin, and took this for ~2 years with no issues but then developed a rash.   Family/Social History: Brother has diabetes   Human resources officer affordability: Pharmacologist  Patient reports adherence with medications.  Current diabetes medications include: glipizide 10 mg BID, Januvia 50 mg daily, Jardiance 10 mg daily - Hx rash with metformin, though he notes he took for ~2 years with no issues Current hypertension medications include: losartan-HCTZ 50-12.5 mg daily  Patient denies recent hypoglycemic s/sx including dizziness, shakiness, sweating. Patient denies hyperglycemic symptoms including polyuria, polydipsia, polyphagia, nocturia, neuropathy, blurred vision, though notes that he had polydipsia, polyuria when he was first diagnosed   O:  Physical Exam  Constitutional: He appears well-developed and well-nourished.  Vitals reviewed.  Review  of Systems  All other systems reviewed and are negative.  Lab Results  Component Value Date   HGBA1C 10.1 (H) 03/28/2018   Vitals:   05/01/18 0827  BP: 371/06    Basic Metabolic Panel BMP Latest Ref Rng & Units 03/28/2018 12/26/2017 09/21/2017  Glucose 70 - 99 mg/dL 169(H) 188(H) 117(H)  BUN 6 - 23 mg/dL 19 16 28(H)  Creatinine 0.40 - 1.50 mg/dL 1.02 1.10 1.05  BUN/Creat Ratio 6 - 22 (calc) - - -  Sodium 135 - 145 mEq/L 141 140 140  Potassium 3.5 - 5.1 mEq/L 4.2 3.8 4.1  Chloride 96 - 112 mEq/L 104 102 103  CO2 19 - 32 mEq/L 29 31 32  Calcium 8.4 - 10.5 mg/dL 9.1 9.5 9.9    Lipid Panel     Component Value Date/Time   CHOL 208 (H) 03/28/2018 0805   TRIG 114.0 03/28/2018 0805   HDL 36.10 (L) 03/28/2018 0805   CHOLHDL 6 03/28/2018 0805   VLDL 22.8 03/28/2018 0805   LDLCALC 149 (H) 03/28/2018 0805   LDLDIRECT 214.0 08/05/2016 0936    Fasting SMBG: 110s-140s, mostly 130s Before supper: Lows to 60s-70s 2 hours after supper: 160-170s  SBP: 100s-110s  Clinical ASCVD: No ;   ASCVD risk factors: age 30-75 10 year ASCVD risk: 27.2% (office BP today, 9/17 lipid panel)  A/P: Following discussion and approval by Dr. Derrel Nip, the following medication changes were made:   #Diabetes - Currently uncontrolled though improved per SMBG results; most recent A1c 10.1% on 03/28/2018, worsened from 9.1% on 12/26/2017; Goal A1c <7% Patient reports hypoglycemic events.  - Increase Jardiance to 25 mg daily for maximal benefit. Patient counseled to take 2 tablets of  his current supply until finished - Decrease glipizide to 5 mg twice daily. Patient counseled that he can discontinue AM glipizide if he experiences more afternoon hypoglycemia, and discontinue evening glipizide if experiencing overnight or early morning hypoglycemia. Low threshold for discontinuation of glipizide. - Continue Januvia 50 mg daily. Moving forward, consider combination SGLT2/DPP4 therapy to reduce pill burden, reduce  copay burden. - Moving forward, can consider rechallenge of metformin therapy. History of rash after 2 years of tolerated treatment is not consistent with a typical medication allergy.   #Hypertension currently controlled per home readings. Goal BP <130/80 Patient reports adherence with medication. - Continue losartan-HCTZ 50/12.5 mg daily. Counseled patient to contact clinic if SBP consistently <100. If so, recommend discontinuation of HCTZ component.   #ASCVD risk - primary prevention in patient aged 45-75 with DM, baseline LDL 70-189, 10 year ASCVD risk 27.2%. High intensity statin indicated per 2019 ADA guidelines for patients with ASCVD risk score >20%. Hx of rash, GI upset with atorvastatin therapy - Discussed initiation of rosuvastatin 10 mg daily. Patient would like to defer initiation and look into this medication prior to starting.  Written patient instructions provided.  Total time in face to face counseling 30 minutes.    Follow up in Pharmacist Clinic Visit 4 weeks.   De Hollingshead, PharmD PGY2 Ambulatory Care Pharmacy Resident Phone: [phone]    I have reviewed the above information and agree with above.   Deborra Medina, MD

## 2018-05-01 NOTE — Assessment & Plan Note (Signed)
#  ASCVD risk - primary prevention in patient aged 59-75 with DM, baseline LDL 70-189, 10 year ASCVD risk 27.2%. High intensity statin indicated per 2019 ADA guidelines for patients with ASCVD risk score >20%. Hx of rash, GI upset with atorvastatin therapy - Discussed initiation of rosuvastatin 10 mg daily. Patient would like to defer initiation and look into this medication prior to starting.

## 2018-05-01 NOTE — Patient Instructions (Signed)
It was great to see you again today! Excellent job!  We are going to make 2 changes today:  1) Increase Jardiance to 25 mg daily. You can take 2 of the 10 mg tablets together until you finish your supply. I have sent the 25 mg tablet strength into your pharmacy.  2) Decrease glipizide to 5 mg (1/2 tablet) with breakfast and supper. If you continue to have afternoon/pre-supper readings less than 80, discontinue the morning glipizide. If you have overnight/early morning low blood sugars, discontinue the evening glipizide.    Keep checking your blood pressures. If you see readings less than 100, give the office a call. We can discontinue the hydrochlorothiazide component of your current blood pressure medication. Continue to stay well hydrated!   Give some thought into starting rosuvastatin (Crestor). The American Diabetes Association recommends that all patients with diabetes take statin therapy.    Schedule follow up with me in 4 weeks.

## 2018-05-24 ENCOUNTER — Telehealth: Payer: Self-pay

## 2018-05-24 NOTE — Telephone Encounter (Signed)
Copied from Central [id number]. Topic: General - Call Back - No Documentation >> May 24, 2018 11:54 AM Sheran Luz wrote: Reason for CRM: Patient states he received a call from office-no VM left.

## 2018-05-25 NOTE — Telephone Encounter (Signed)
Don't see why this pt was called.

## 2018-06-05 ENCOUNTER — Encounter: Payer: Self-pay | Admitting: Pharmacist

## 2018-06-05 ENCOUNTER — Ambulatory Visit: Payer: BLUE CROSS/BLUE SHIELD | Admitting: Internal Medicine

## 2018-06-05 ENCOUNTER — Encounter: Payer: Self-pay | Admitting: Internal Medicine

## 2018-06-05 ENCOUNTER — Ambulatory Visit (INDEPENDENT_AMBULATORY_CARE_PROVIDER_SITE_OTHER): Payer: BLUE CROSS/BLUE SHIELD | Admitting: Pharmacist

## 2018-06-05 VITALS — BP 128/80 | HR 74 | Temp 98.2°F | Resp 15 | Ht 68.0 in | Wt 162.6 lb

## 2018-06-05 DIAGNOSIS — E1165 Type 2 diabetes mellitus with hyperglycemia: Secondary | ICD-10-CM | POA: Diagnosis not present

## 2018-06-05 DIAGNOSIS — R21 Rash and other nonspecific skin eruption: Secondary | ICD-10-CM

## 2018-06-05 DIAGNOSIS — IMO0002 Reserved for concepts with insufficient information to code with codable children: Secondary | ICD-10-CM

## 2018-06-05 DIAGNOSIS — E1169 Type 2 diabetes mellitus with other specified complication: Secondary | ICD-10-CM | POA: Diagnosis not present

## 2018-06-05 DIAGNOSIS — I10 Essential (primary) hypertension: Secondary | ICD-10-CM | POA: Diagnosis not present

## 2018-06-05 DIAGNOSIS — E118 Type 2 diabetes mellitus with unspecified complications: Secondary | ICD-10-CM

## 2018-06-05 DIAGNOSIS — E785 Hyperlipidemia, unspecified: Secondary | ICD-10-CM

## 2018-06-05 LAB — CBC WITH DIFFERENTIAL/PLATELET
BASOS ABS: 0 10*3/uL (ref 0.0–0.1)
BASOS PCT: 0.3 % (ref 0.0–3.0)
Eosinophils Absolute: 0 10*3/uL (ref 0.0–0.7)
Eosinophils Relative: 0.9 % (ref 0.0–5.0)
HEMATOCRIT: 51.1 % (ref 39.0–52.0)
HEMOGLOBIN: 16.9 g/dL (ref 13.0–17.0)
Lymphocytes Relative: 32.1 % (ref 12.0–46.0)
Lymphs Abs: 1.3 10*3/uL (ref 0.7–4.0)
MCHC: 33 g/dL (ref 30.0–36.0)
MCV: 92.7 fl (ref 78.0–100.0)
MONO ABS: 0.3 10*3/uL (ref 0.1–1.0)
Monocytes Relative: 7.5 % (ref 3.0–12.0)
NEUTROS PCT: 59.2 % (ref 43.0–77.0)
Neutro Abs: 2.4 10*3/uL (ref 1.4–7.7)
Platelets: 147 10*3/uL — ABNORMAL LOW (ref 150.0–400.0)
RBC: 5.51 Mil/uL (ref 4.22–5.81)
RDW: 14.8 % (ref 11.5–15.5)
WBC: 4.1 10*3/uL (ref 4.0–10.5)

## 2018-06-05 LAB — LIPID PANEL
CHOLESTEROL: 221 mg/dL — AB (ref 0–200)
HDL: 42.4 mg/dL (ref >39.00)
LDL Cholesterol: 159 mg/dL — ABNORMAL HIGH (ref 0–99)
NonHDL: 178.37
TRIGLYCERIDES: 97 mg/dL (ref 0.0–149.0)
Total CHOL/HDL Ratio: 5
VLDL: 19.4 mg/dL (ref 0.0–40.0)

## 2018-06-05 LAB — COMPREHENSIVE METABOLIC PANEL
ALBUMIN: 4.1 g/dL (ref 3.5–5.2)
ALK PHOS: 62 U/L (ref 39–117)
ALT: 38 U/L (ref 0–53)
AST: 24 U/L (ref 0–37)
BILIRUBIN TOTAL: 0.8 mg/dL (ref 0.2–1.2)
BUN: 16 mg/dL (ref 6–23)
CO2: 32 mEq/L (ref 19–32)
Calcium: 9.7 mg/dL (ref 8.4–10.5)
Chloride: 103 mEq/L (ref 96–112)
Creatinine, Ser: 1.1 mg/dL (ref 0.40–1.50)
GFR: 72.77 mL/min (ref >60.00)
Glucose, Bld: 171 mg/dL — ABNORMAL HIGH (ref 70–99)
POTASSIUM: 4.1 meq/L (ref 3.5–5.1)
Sodium: 140 mEq/L (ref 135–145)
TOTAL PROTEIN: 7.4 g/dL (ref 6.0–8.3)

## 2018-06-05 LAB — SEDIMENTATION RATE: SED RATE: 14 mm/h (ref 0–20)

## 2018-06-05 MED ORDER — ROSUVASTATIN CALCIUM 10 MG PO TABS: 10.0000 mg | ORAL_TABLET | Freq: Every day | ORAL | 3 refills | Status: DC

## 2018-06-05 NOTE — Assessment & Plan Note (Addendum)
Now meeting with pharmacist for medication adjustment .  Control is compromised by recurrent medication lapses due to rashes of undetermined etiology, but attributed to medications by patient.  - Currently uncontrolled though improved per SMBG results; most recent A1c 10.1% on 03/28/2018, worsened from 9.1% on 12/26/2017; Goal A1c <7% Patient reports hypoglycemic events.  - Increase Jardiance to 25 mg daily for maximal benefit. Patient counseled to take 2 tablets of his current supply until finished - Decrease glipizide to 5 mg twice daily. Patient counseled that he can discontinue AM glipizide if he experiences more afternoon hypoglycemia, and discontinue evening glipizide if experiencing overnight or early morning hypoglycemia. Low threshold for discontinuation of glipizide. - Continue Januvia 50 mg daily. Moving forward, consider combination SGLT2/DPP4 therapy to reduce pill burden, reduce copay burden. - Moving forward, can consider rechallenge of metformin therapy. History of rash after 2 years of tolerated treatment is not consistent with a typical medication allergy.

## 2018-06-05 NOTE — Patient Instructions (Addendum)
It was great to see you today!  I agree with Dr. Derrel Nip, I do not believe this rash was related to your medications, since you developed it a few weeks after starting the Jardiance.  1) Restart Januvia 50 mg daily and Jardiance 25 mg daily.  2) Decrease glipizide XL to 5 mg twice daily. If you continue to have low blood sugars, feel free to stop the glipizide.  3) We are going to start rosuvastatin 10 mg daily (generic for Crestor). This medications helps your cholesterol and helps reduce your risk of cardiovascular disease.   Moving forward, we could consider combining the Jardiance with a medication like Januvia (Tradjenta, or linagliptin) into a medication called Glyxambi. Let us know when you would like to do this.    I'm going to order an A1c for you to come get in December. Make a lab appointment to have it checked anytime after December 17th. If your A1c is still greater than 7%, schedule an appointment with me in January.    Feel free to reach out with any questions or concerns!  Catie Darnelle Maffucci, PharmD

## 2018-06-05 NOTE — Patient Instructions (Signed)
I think your recurrent rashes are coincidental and not due to your medications,. Since it is happening so late after starting them  Based on your blood pressure,  You do not need to resume the losartan however,    returnfor A1c after Dec 17th  Referral to dermatology and allergist in process

## 2018-06-05 NOTE — Assessment & Plan Note (Signed)
Diabetes - Currently uncontrolled though improved per SMBG results when taking his medications; most recent A1c 10.1% on 03/28/2018, worsened from 9.1% on 12/26/2017; Goal A1c <7%. Patient reports hypoglycemic events. He is amenable to restarting diabetic medications. We discussed consolidating SGLT2/DPP4 therapy to combination medication Glyxambi (empagliflozin/linagliptin), but he deferred that switch at this time - Continue Jardiance 25 mg, Januvia 50 mg daily.  - Decrease glipizide XL to 5 mg BID as previously instructed. Patient counseled to discontinue glipizide if low blood sugars continue.  - A1c due after 06/27/2018

## 2018-06-05 NOTE — Progress Notes (Signed)
Subjective:  Patient ID: George Gordon, male    DOB: 13-Mar-1961  Age: 57 y.o. MRN: [id number]  CC: The primary encounter diagnosis was Rash, skin. Diagnoses of Rash and nonspecific skin eruption, Diabetes mellitus type 2, uncontrolled, with complications ([place]), Hyperlipidemia due to type 2 diabetes mellitus ([place]), Rash of back, and Essential hypertension were also pertinent to this visit.  HPI George Gordon presents for follow up on uncontrolled diabetes , hyperlipidemia, and hypertension   He has stopped all medications because he recently developed another rash while at home preparing to travel to a conference.  Happened ten days ago , but he went to [place],  Rash lasted 4 days  Covered most of his back  And the posterior surface of both upper arms.  Did not affect chest , legs or face . Rash was raised and itchy. No body aches,  Fevers or malaise.    Uses an Otc antibacterial  soap called Asepso.  He had stopped taking antihistamine when summer ended.  . Macular edelma and diabetic retinaopthy No longer on eye drops per ophthalmology (Ketorolac and prednisonolone)  For macular edema. Now resolved.   Has diabetic retinopathy,  follow up in July        Outpatient Medications Prior to Visit  Medication Sig Dispense Refill  . Cetirizine HCl (ZYRTEC ALLERGY) 10 MG CAPS Take 1 capsule by mouth daily.    . empagliflozin (JARDIANCE) 25 MG TABS tablet Take 25 mg by mouth daily. (Patient not taking: Reported on 06/05/2018) 30 tablet 2  . folic acid (FOLVITE) 761 MCG tablet Take 400 mcg by mouth daily. Reported on 07/25/2015    . glipiZIDE (GLUCOTROL) 10 MG tablet Take 0.5 tablets (5 mg total) by mouth 2 (two) times daily. 180 tablet 1  . glucose blood test strip Please dispense Contour Next test strips to check Blood glucose twice a day, (90 day supply) E11.9 200 each 3  . losartan (COZAAR) 50 MG tablet   0  . losartan-hydrochlorothiazide (HYZAAR) 50-12.5 MG tablet Take 1 tablet by mouth daily.  (Patient not taking: Reported on 06/05/2018) 90 tablet 3  . sitaGLIPtin (JANUVIA) 50 MG tablet Take 1 tablet (50 mg total) by mouth daily.    . vitamin C (ASCORBIC ACID) 500 MG tablet Take 500 mg by mouth daily. Reported on 07/25/2015    . vitamin E 400 UNIT capsule Take 400 Units by mouth daily.    Marland Kitchen ketorolac (ACULAR) 0.5 % ophthalmic solution   1  . prednisoLONE acetate (PRED FORTE) 1 % ophthalmic suspension   0   No facility-administered medications prior to visit.     Review of Systems;  Patient denies headache, fevers, malaise, unintentional weight loss, skin rash, eye pain, sinus congestion and sinus pain, sore throat, dysphagia,  hemoptysis , cough, dyspnea, wheezing, chest pain, palpitations, orthopnea, edema, abdominal pain, nausea, melena, diarrhea, constipation, flank pain, dysuria, hematuria, urinary  Frequency, nocturia, numbness, tingling, seizures,  Focal weakness, Loss of consciousness,  Tremor, insomnia, depression, anxiety, and suicidal ideation.      Objective:  BP 128/80 (BP Location: Left Arm, Patient Position: Sitting, Cuff Size: Normal)   Pulse 74   Temp 98.2 F (36.8 C) (Oral)   Resp 15   Ht 5\' 8"  (1.727 m)   Wt 162 lb 9.6 oz (73.8 kg)   SpO2 96%   BMI 24.72 kg/m   BP Readings from Last 3 Encounters:  06/05/18 128/80  05/01/18 133/83  04/03/18 121/77    Wt Readings  from Last 3 Encounters:  06/05/18 162 lb 9.6 oz (73.8 kg)  05/01/18 160 lb 12.8 oz (72.9 kg)  04/03/18 158 lb 3.2 oz (71.8 kg)    General appearance: alert, cooperative and appears stated age Ears: normal TM's and external ear canals both ears Throat: lips, mucosa, and tongue normal; teeth and gums normal Neck: no adenopathy, no carotid bruit, supple, symmetrical, trachea midline and thyroid not enlarged, symmetric, no tenderness/mass/nodules Back: symmetric, no curvature. ROM normal. No CVA tenderness. Lungs: clear to auscultation bilaterally Heart: regular rate and rhythm, S1, S2  normal, no murmur, click, rub or gallop Abdomen: soft, non-tender; bowel sounds normal; no masses,  no organomegaly Pulses: 2+ and symmetric Skin: Skin color, texture, turgor normal. No rashes or lesions Lymph nodes: Cervical, supraclavicular, and axillary nodes normal.  Lab Results  Component Value Date   HGBA1C 10.1 (H) 03/28/2018   HGBA1C 9.1 (H) 12/26/2017   HGBA1C 8.2 (H) 09/21/2017    Lab Results  Component Value Date   CREATININE 1.10 06/05/2018   CREATININE 1.02 03/28/2018   CREATININE 1.10 12/26/2017    Lab Results  Component Value Date   WBC 4.1 06/05/2018   HGB 16.9 06/05/2018   HCT 51.1 06/05/2018   PLT 147.0 (L) 06/05/2018   GLUCOSE 171 (H) 06/05/2018   CHOL 221 (H) 06/05/2018   TRIG 97.0 06/05/2018   HDL 42.40 06/05/2018   LDLDIRECT 214.0 08/05/2016   LDLCALC 159 (H) 06/05/2018   ALT 38 06/05/2018   AST 24 06/05/2018   NA 140 06/05/2018   K 4.1 06/05/2018   CL 103 06/05/2018   CREATININE 1.10 06/05/2018   BUN 16 06/05/2018   CO2 32 06/05/2018   TSH 0.84 09/21/2017   HGBA1C 10.1 (H) 03/28/2018   MICROALBUR 0.9 09/21/2017    No results found.  Assessment & Plan:   Problem List Items Addressed This Visit    Diabetes mellitus type 2, uncontrolled, with complications [facility])    Now meeting with pharmacist for medication adjustment .  Control is compromised by recurrent medication lapses due to rashes of undetermined etiology, but attributed to medications by patient.  - Currently uncontrolled though improved per SMBG results; most recent A1c 10.1% on 03/28/2018, worsened from 9.1% on 12/26/2017; Goal A1c <7% Patient reports hypoglycemic events.  - Increase Jardiance to 25 mg daily for maximal benefit. Patient counseled to take 2 tablets of his current supply until finished - Decrease glipizide to 5 mg twice daily. Patient counseled that he can discontinue AM glipizide if he experiences more afternoon hypoglycemia, and discontinue evening glipizide if  experiencing overnight or early morning hypoglycemia. Low threshold for discontinuation of glipizide. - Continue Januvia 50 mg daily. Moving forward, consider combination SGLT2/DPP4 therapy to reduce pill burden, reduce copay burden. - Moving forward, can consider rechallenge of metformin therapy. History of rash after 2 years of tolerated treatment is not consistent with a typical medication allergy.       Relevant Medications   losartan (COZAAR) 50 MG tablet   Other Relevant Orders   Comprehensive metabolic panel (Completed)   Lipid panel (Completed)   Essential hypertension    At goal without losartan .  Advised to continue suspension for now       Relevant Medications   losartan (COZAAR) 50 MG tablet   Hyperlipidemia due to type 2 diabetes mellitus ([place])    Untreated secondary to intolerance to atorvastatin.  His 10 yr risk is 27% and statin therapy has been advised.  Relevant Medications   losartan (COZAAR) 50 MG tablet   Rash of back    Recurrent,  attributed to meds by patient , but chronicity does not support this .  Referring to dermatology and  Allergy medicine        Other Visit Diagnoses    Rash, skin    -  Primary   Relevant Orders   Ambulatory referral to Dermatology   Ambulatory referral to Allergy   Rash and nonspecific skin eruption       Relevant Orders   CBC with Differential/Platelet (Completed)   Sedimentation rate (Completed)      I am having George Gordon maintain his vitamin C, folic acid, vitamin E, glucose blood, Cetirizine HCl, losartan-hydrochlorothiazide, sitaGLIPtin, glipiZIDE, empagliflozin, and losartan.  No orders of the defined types were placed in this encounter.   There are no discontinued medications.  Follow-up: No follow-ups on file.   Crecencio Mc, MD

## 2018-06-05 NOTE — Assessment & Plan Note (Signed)
Recurrent,  attributed to meds by patient , but chronicity does not support this .  Referring to dermatology and  Allergy medicine

## 2018-06-05 NOTE — Assessment & Plan Note (Signed)
Untreated secondary to intolerance to atorvastatin.  His 10 yr risk is 27% and statin therapy has been advised.

## 2018-06-05 NOTE — Progress Notes (Signed)
S:     Chief Complaint  Patient presents with  . Medication Management    Diabetes   Patient arrives in good spirits, ambulating without assistance.  Presents for diabetes evaluation, education, and management at the request of Dr. Derrel Nip (referred on 03/30/2018). Last seen by Pharmacy on 10/21//2019 - at that time, Jardiance was increased to 25 mg daily and glipizide was decreased.   Today, he saw Dr. Derrel Nip for follow up. He notes that last week, he was at a conference and developed a rash. He believed this to be related to his medications, so he discontinued all medications. The rash did resolve. Dr. Derrel Nip is referring to an allergist to further investigate his history of allergic reactions.  He notes that he looked into rosuvastatin, and is interested in starting this medication today.   Patient reports diabetes was diagnosed in ~2015 at a health fair at his work. He notes that he was originally prescribed metformin, and took this for ~2 years with no issues but then developed a rash.   Insurance coverage/medication affordability: Pharmacologist  Patient denies adherence with medications in the past week. Current diabetes medications include: glipizide 10 mg BID, Januvia 50 mg daily, Jardiance 25 mg daily - Hx rash with metformin, though he notes he took for ~2 years with no issues  Current hypertension medications include: losartan-HCTZ 50-12.5 mg daily (notes the pharmacy is going to dispense as separate prescriptions d/t the recall issue)  Patient notes recent hypoglycemic s/sx including dizziness, shakiness, sweating. Patient denies hyperglycemic symptoms including polyuria, polydipsia, polyphagia, nocturia, neuropathy, blurred vision, though notes that he had polydipsia, polyuria when he was first diagnosed   O:  Physical Exam  Constitutional: He appears well-developed and well-nourished.  Vitals reviewed.  Review of Systems  All other systems reviewed and are  negative.  Lab Results  Component Value Date   HGBA1C 10.1 (H) 07/37/1062   Basic Metabolic Panel BMP Latest Ref Rng & Units 03/28/2018 12/26/2017 09/21/2017  Glucose 70 - 99 mg/dL 169(H) 188(H) 117(H)  BUN 6 - 23 mg/dL 19 16 28(H)  Creatinine 0.40 - 1.50 mg/dL 1.02 1.10 1.05  BUN/Creat Ratio 6 - 22 (calc) - - -  Sodium 135 - 145 mEq/L 141 140 140  Potassium 3.5 - 5.1 mEq/L 4.2 3.8 4.1  Chloride 96 - 112 mEq/L 104 102 103  CO2 19 - 32 mEq/L 29 31 32  Calcium 8.4 - 10.5 mg/dL 9.1 9.5 9.9    Lipid Panel     Component Value Date/Time   CHOL 208 (H) 03/28/2018 0805   TRIG 114.0 03/28/2018 0805   HDL 36.10 (L) 03/28/2018 0805   CHOLHDL 6 03/28/2018 0805   VLDL 22.8 03/28/2018 0805   LDLCALC 149 (H) 03/28/2018 0805   LDLDIRECT 214.0 08/05/2016 0936    Fasting SMBG: 164 this morning (no medications); 116-160s; all less than 130 prior to stopping medications Before supper: Lows to the 60s before supper  SBP: 100s-120s  Clinical ASCVD: No ;   ASCVD risk factors: age 30-75 10 year ASCVD risk: 27.2%   A/P: Following discussion and approval by Dr. Derrel Nip, the following medication changes were made:   #Diabetes - Currently uncontrolled though improved per SMBG results when taking his medications; most recent A1c 10.1% on 03/28/2018, worsened from 9.1% on 12/26/2017; Goal A1c <7%. Patient reports hypoglycemic events. He is amenable to restarting diabetic medications. We discussed consolidating SGLT2/DPP4 therapy to combination medication Glyxambi (empagliflozin/linagliptin), but he deferred that switch at this  time - Continue Jardiance 25 mg, Januvia 50 mg daily.  - Decrease glipizide XL to 5 mg BID as previously instructed. Patient counseled to discontinue glipizide if low blood sugars continue.  - A1c due after 06/27/2018  #ASCVD risk - 10 year ASCVD risk 27.2%. High intensity statin indicated per 2019 ADA guidelines for patients with ASCVD risk score >20%. Hx of rash, GI upset with  atorvastatin therapy - Initiate rosuvastatin 10 mg daily. Follow up lipid panel in 6-8 weeks.   Written patient instructions provided.  Total time in face to face counseling 30 minutes.    Follow up in Pharmacist Clinic Visit 4 weeks for A1c  De Hollingshead, PharmD PGY2 Ambulatory Care Pharmacy Resident Phone: [phone]

## 2018-06-05 NOTE — Assessment & Plan Note (Signed)
At goal without losartan .  Advised to continue suspension for now

## 2018-06-05 NOTE — Assessment & Plan Note (Signed)
#  ASCVD risk - 10 year ASCVD risk 27.2%. High intensity statin indicated per 2019 ADA guidelines for patients with ASCVD risk score >20%. Hx of rash, GI upset with atorvastatin therapy - Initiate rosuvastatin 10 mg daily. Follow up lipid panel in 6-8 weeks.

## 2018-06-07 ENCOUNTER — Other Ambulatory Visit: Payer: Self-pay | Admitting: Internal Medicine

## 2018-06-07 DIAGNOSIS — E1165 Type 2 diabetes mellitus with hyperglycemia: Secondary | ICD-10-CM

## 2018-06-07 DIAGNOSIS — E118 Type 2 diabetes mellitus with unspecified complications: Secondary | ICD-10-CM

## 2018-06-07 DIAGNOSIS — IMO0002 Reserved for concepts with insufficient information to code with codable children: Secondary | ICD-10-CM

## 2018-06-07 DIAGNOSIS — E1169 Type 2 diabetes mellitus with other specified complication: Secondary | ICD-10-CM

## 2018-06-07 DIAGNOSIS — E785 Hyperlipidemia, unspecified: Principal | ICD-10-CM

## 2018-06-07 NOTE — Progress Notes (Signed)
Lab Results  Component Value Date   HGBA1C 10.1 (H) 03/28/2018

## 2018-06-11 NOTE — Progress Notes (Signed)
  I have reviewed the above information and agree with above.   Mishawn Hemann, MD 

## 2018-06-29 ENCOUNTER — Other Ambulatory Visit: Payer: Self-pay | Admitting: Internal Medicine

## 2018-06-29 DIAGNOSIS — E1165 Type 2 diabetes mellitus with hyperglycemia: Secondary | ICD-10-CM

## 2018-06-29 DIAGNOSIS — E118 Type 2 diabetes mellitus with unspecified complications: Principal | ICD-10-CM

## 2018-06-29 DIAGNOSIS — IMO0002 Reserved for concepts with insufficient information to code with codable children: Secondary | ICD-10-CM

## 2018-07-10 ENCOUNTER — Other Ambulatory Visit (INDEPENDENT_AMBULATORY_CARE_PROVIDER_SITE_OTHER): Payer: BLUE CROSS/BLUE SHIELD

## 2018-07-10 ENCOUNTER — Encounter: Payer: Self-pay | Admitting: Pharmacist

## 2018-07-10 ENCOUNTER — Ambulatory Visit: Payer: BLUE CROSS/BLUE SHIELD | Admitting: Pharmacist

## 2018-07-10 VITALS — Wt 158.8 lb

## 2018-07-10 DIAGNOSIS — E1165 Type 2 diabetes mellitus with hyperglycemia: Secondary | ICD-10-CM | POA: Diagnosis not present

## 2018-07-10 DIAGNOSIS — E1169 Type 2 diabetes mellitus with other specified complication: Secondary | ICD-10-CM

## 2018-07-10 DIAGNOSIS — E118 Type 2 diabetes mellitus with unspecified complications: Secondary | ICD-10-CM

## 2018-07-10 DIAGNOSIS — IMO0002 Reserved for concepts with insufficient information to code with codable children: Secondary | ICD-10-CM

## 2018-07-10 DIAGNOSIS — E785 Hyperlipidemia, unspecified: Secondary | ICD-10-CM

## 2018-07-10 LAB — COMPREHENSIVE METABOLIC PANEL
ALBUMIN: 4.1 g/dL (ref 3.5–5.2)
ALT: 23 U/L (ref 0–53)
AST: 19 U/L (ref 0–37)
Alkaline Phosphatase: 75 U/L (ref 39–117)
BUN: 18 mg/dL (ref 6–23)
CHLORIDE: 105 meq/L (ref 96–112)
CO2: 27 meq/L (ref 19–32)
CREATININE: 1.12 mg/dL (ref 0.40–1.50)
Calcium: 9.4 mg/dL (ref 8.4–10.5)
GFR: 71.25 mL/min (ref >60.00)
GLUCOSE: 131 mg/dL — AB (ref 70–99)
POTASSIUM: 3.9 meq/L (ref 3.5–5.1)
SODIUM: 142 meq/L (ref 135–145)
Total Bilirubin: 0.9 mg/dL (ref 0.2–1.2)
Total Protein: 6.9 g/dL (ref 6.0–8.3)

## 2018-07-10 LAB — LIPID PANEL
CHOL/HDL RATIO: 4
CHOLESTEROL: 145 mg/dL (ref 0–200)
HDL: 35.9 mg/dL — ABNORMAL LOW (ref >39.00)
LDL Cholesterol: 94 mg/dL (ref 0–99)
NONHDL: 109.54
Triglycerides: 76 mg/dL (ref 0.0–149.0)
VLDL: 15.2 mg/dL (ref 0.0–40.0)

## 2018-07-10 NOTE — Assessment & Plan Note (Signed)
#  ASCVD risk - High intensity statin indicated d/t baseline ASCVD risk >20% - Continue rosuvastatin 10 mg daily - Lipid panel today

## 2018-07-10 NOTE — Assessment & Plan Note (Signed)
#  Diabetes - Currently uncontrolled, though improvced per SMBG results when taking his medications; most recent A1c 10.1% on 03/28/2018; Goal A1c <7%.  - Continue Jardiance 25 mg, Januvia 50 mg daily. Check A1c today, BMP s/p starting Jardiance - Discontinue glipizide to prevent hypoglycemia - Encouraged patient to continue to make appropriate dietary choices, engage in physical activity

## 2018-07-10 NOTE — Patient Instructions (Addendum)
It was great to see you today! I am so excited by how good your blood sugars look.   Stop glipizide. Continue Jardiance 25 mg and Januvia 50 mg.   We are going to check an A1c, kidney function, and cholesterol panel today. Dr. Derrel Nip can discuss these results with you tomorrow.    Please feel free to call clinic with any questions or concerns!  Catie Darnelle Maffucci, PharmD

## 2018-07-10 NOTE — Progress Notes (Addendum)
   S:     Chief Complaint  Patient presents with  . Medication Management    Diabetes   Patient arrives in good spirits, ambulating without assistance. He does note that he believes he has a cold, and has an appointment with Dr. Derrel Nip tomorrow.  Presents for diabetes evaluation, education, and management at the request of Dr. Derrel Nip (referred on 03/30/2018). Last seen by Pharmacy and PCP on 06/07/2018 - at that time, he was encouraged to restart his medications. He had developed a rash and had stopped all medications; rash had not improved. Rosuvastatin was initiated- he denies any concerns with this medication today.  Insurance coverage/medication affordability: Pharmacologist  Patient reports adherence with medications. Current diabetes medications include: glipizide 5 mg BID, Januvia 50 mg daily, Jardiance 25 mg daily - Hx rash with metformin, though he notes he took for ~2 years with no issues  Current hypertension medications include: losartan 50 mg daily, HCTZ 25 mg daily (recently changed to separate pills due to ARB-HCTZ recall issues at the pharmacy).  Current hyperlipidemia medications include: rosuvastatin 10 mg daily    O:  Physical Exam Vitals signs reviewed.  Constitutional:      Appearance: He is well-developed.    Review of Systems  All other systems reviewed and are negative.  Lab Results  Component Value Date   HGBA1C 10.1 (H) 88/50/2774   Basic Metabolic Panel BMP Latest Ref Rng & Units 06/05/2018 03/28/2018 12/26/2017  Glucose 70 - 99 mg/dL 171(H) 169(H) 188(H)  BUN 6 - 23 mg/dL 16 19 16   Creatinine 0.40 - 1.50 mg/dL 1.10 1.02 1.10  BUN/Creat Ratio 6 - 22 (calc) - - -  Sodium 135 - 145 mEq/L 140 141 140  Potassium 3.5 - 5.1 mEq/L 4.1 4.2 3.8  Chloride 96 - 112 mEq/L 103 104 102  CO2 19 - 32 mEq/L 32 29 31  Calcium 8.4 - 10.5 mg/dL 9.7 9.1 9.5    Lipid Panel     Component Value Date/Time   CHOL 221 (H) 06/05/2018 0917   TRIG 97.0 06/05/2018 0917   HDL 42.40 06/05/2018 0917   CHOLHDL 5 06/05/2018 0917   VLDL 19.4 06/05/2018 0917   LDLCALC 159 (H) 06/05/2018 0917   LDLDIRECT 214.0 08/05/2016 0936    Fasting SMBG: All <130  SBP: All <130  Clinical ASCVD: No ;   ASCVD risk factors: age 43-75 10 year ASCVD risk: 27.2% (before starting rosuvastatin)  A/P: Following discussion and approval by Dr. Derrel Nip, the following medication changes were made:   #Diabetes - Currently uncontrolled, though improvced per SMBG results when taking his medications; most recent A1c 10.1% on 03/28/2018; Goal A1c <7%.  - Continue Jardiance 25 mg, Januvia 50 mg daily. Check A1c today, BMP s/p starting Jardiance - Discontinue glipizide to prevent hypoglycemia - Encouraged patient to continue to make appropriate dietary choices, engage in physical activity  #ASCVD risk - High intensity statin indicated d/t baseline ASCVD risk >20% - Continue rosuvastatin 10 mg daily - Lipid panel today  Written patient instructions provided.  Total time in face to face counseling 20 minutes.    Follow up in Pharmacist Clinic TBD pending lab results  De Hollingshead, PharmD PGY2 Ambulatory Care Pharmacy Resident Phone: [phone]   I have reviewed the above information and agree with above.   Deborra Medina, MD

## 2018-07-11 ENCOUNTER — Ambulatory Visit: Payer: BLUE CROSS/BLUE SHIELD | Admitting: Internal Medicine

## 2018-07-11 ENCOUNTER — Encounter: Payer: Self-pay | Admitting: Internal Medicine

## 2018-07-11 VITALS — BP 130/76 | HR 74 | Temp 98.0°F | Resp 16 | Ht 68.0 in | Wt 159.0 lb

## 2018-07-11 DIAGNOSIS — H6690 Otitis media, unspecified, unspecified ear: Secondary | ICD-10-CM | POA: Diagnosis not present

## 2018-07-11 DIAGNOSIS — R6889 Other general symptoms and signs: Secondary | ICD-10-CM | POA: Diagnosis not present

## 2018-07-11 LAB — POCT INFLUENZA A/B
Influenza A, POC: NEGATIVE
Influenza B, POC: NEGATIVE

## 2018-07-11 MED ORDER — AMOXICILLIN-POT CLAVULANATE 875-125 MG PO TABS: 1.0000 | ORAL_TABLET | Freq: Two times a day (BID) | ORAL | 0 refills | Status: DC

## 2018-07-11 MED ORDER — GUAIFENESIN-CODEINE 100-10 MG/5ML PO SYRP: 5.0000 mL | ORAL_SOLUTION | Freq: Three times a day (TID) | ORAL | 0 refills | Status: DC | PRN

## 2018-07-11 MED ORDER — PREDNISONE 10 MG PO TABS: ORAL_TABLET | ORAL | 0 refills | Status: DC

## 2018-07-11 NOTE — Progress Notes (Signed)
hb  Subjective:  Patient ID: George Gordon, male    DOB: [birth year]  Age: 57 y.o. MRN: [id number]  CC: The primary encounter diagnosis was Flu-like symptoms. A diagnosis of Acute otitis media, unspecified otitis media type was also pertinent to this visit.  HPI George Gordon presents for evaluation of flu like symptoms.  Patient reports waking up with very sore throat  Dec 26th, body aches, sneezing rhinitis and bilateral ear pain.  No fevers.  Intermittent headache.  George Gordon  son was treated with amoxicillin  for bilateral otitis  And cough on Dec 25 . Has not been taking any OTC meds.   Wife has also recently been treated for bronchitis   Outpatient Medications Prior to Visit  Medication Sig Dispense Refill  . Cetirizine HCl (ZYRTEC ALLERGY) 10 MG CAPS Take 1 capsule by mouth daily.    . empagliflozin (JARDIANCE) 25 MG TABS tablet TAKE ONE TABLET EVERY DAY 30 tablet 2  . folic acid (FOLVITE) 235 MCG tablet Take 400 mcg by mouth daily. Reported on 07/25/2015    . glucose blood test strip Please dispense Contour Next test strips to check Blood glucose twice a day, (90 day supply) E11.9 200 each 3  . hydrochlorothiazide (HYDRODIURIL) 25 MG tablet Take 25 mg by mouth daily.    Marland Kitchen losartan (COZAAR) 50 MG tablet   0  . rosuvastatin (CRESTOR) 10 MG tablet Take 1 tablet (10 mg total) by mouth daily. 90 tablet 3  . sitaGLIPtin (JANUVIA) 50 MG tablet Take 1 tablet (50 mg total) by mouth daily.    . vitamin C (ASCORBIC ACID) 500 MG tablet Take 500 mg by mouth daily. Reported on 07/25/2015    . vitamin E 400 UNIT capsule Take 400 Units by mouth daily.     No facility-administered medications prior to visit.     Review of Systems;  Patient denies , fevers,  unintentional weight loss, skin rash, eye pain,  dysphagia,  hemoptysis , dyspnea, wheezing, chest pain, palpitations, orthopnea, edema, abdominal pain, nausea, melena, diarrhea, constipation, flank pain, dysuria, hematuria, urinary  Frequency,  nocturia, numbness, tingling, seizures,  Focal weakness, Loss of consciousness,  Tremor, insomnia, depression, anxiety, and suicidal ideation.      Objective:  BP 130/76 (BP Location: Left Arm, Patient Position: Sitting, Cuff Size: Normal)   Pulse 74   Temp 98 F (36.7 C) (Oral)   Resp 16   Ht 5\' 8"  (1.727 m)   Wt 159 lb (72.1 kg)   SpO2 94%   BMI 24.18 kg/m   BP Readings from Last 3 Encounters:  07/11/18 130/76  06/05/18 128/80  05/01/18 133/83    Wt Readings from Last 3 Encounters:  07/11/18 159 lb (72.1 kg)  07/10/18 158 lb 12.8 oz (72 kg)  06/05/18 162 lb 9.6 oz (73.8 kg)    General appearance: alert, cooperative and appears stated age Ears: normal right TM's,  LEFT TM INJECTED  Throat: lips, mucosa, and tongue normal; teeth and gums normal Neck: tender cervical, submental  and pre/post auricular adenopathy, no carotid bruit, supple, symmetrical, trachea midline and thyroid not enlarged, symmetric, no tenderness/mass/nodules Back: symmetric, no curvature. ROM normal. No CVA tenderness. Lungs: clear to auscultation bilaterally. No wheezing  Heart: regular rate and rhythm, S1, S2 normal, no murmur, click, rub or gallop Abdomen: soft, non-tender; bowel sounds normal; no masses,  no organomegaly Pulses: 2+ and symmetric Skin: Skin color, texture, turgor normal. No rashes or lesions   Lab Results  Component Value Date  HGBA1C 10.1 (H) 03/28/2018   HGBA1C 9.1 (H) 12/26/2017   HGBA1C 8.2 (H) 09/21/2017    Lab Results  Component Value Date   CREATININE 1.12 07/10/2018   CREATININE 1.10 06/05/2018   CREATININE 1.02 03/28/2018    Lab Results  Component Value Date   WBC 4.1 06/05/2018   HGB 16.9 06/05/2018   HCT 51.1 06/05/2018   PLT 147.0 (L) 06/05/2018   GLUCOSE 131 (H) 07/10/2018   CHOL 145 07/10/2018   TRIG 76.0 07/10/2018   HDL 35.90 (L) 07/10/2018   LDLDIRECT 214.0 08/05/2016   LDLCALC 94 07/10/2018   ALT 23 07/10/2018   AST 19 07/10/2018   NA 142  07/10/2018   K 3.9 07/10/2018   CL 105 07/10/2018   CREATININE 1.12 07/10/2018   BUN 18 07/10/2018   CO2 27 07/10/2018   TSH 0.84 09/21/2017   HGBA1C 10.1 (H) 03/28/2018   MICROALBUR 0.9 09/21/2017    No results found.  Assessment & Plan:   Problem List Items Addressed This Visit    Otitis media    Rapid flu test negative.  Symptoms present for 6 days.  Empiric treatment with abx,  Prednisone, cough suppressant  Probiotic advised       Relevant Medications   amoxicillin-clavulanate (AUGMENTIN) 875-125 MG tablet    Other Visit Diagnoses    Flu-like symptoms    -  Primary   Relevant Orders   POCT Influenza A/B (Completed)      I am having George Gordon start on amoxicillin-clavulanate, predniSONE, and guaiFENesin-codeine. I am also having him maintain his vitamin C, folic acid, vitamin E, glucose blood, Cetirizine HCl, sitaGLIPtin, losartan, rosuvastatin, empagliflozin, and hydrochlorothiazide.  Meds ordered this encounter  Medications  . amoxicillin-clavulanate (AUGMENTIN) 875-125 MG tablet    Sig: Take 1 tablet by mouth 2 (two) times daily.    Dispense:  14 tablet    Refill:  0  . predniSONE (DELTASONE) 10 MG tablet    Sig: 6 tablets on Day 1 , then reduce by 1 tablet daily until gone    Dispense:  21 tablet    Refill:  0  . guaiFENesin-codeine (CHERATUSSIN AC) 100-10 MG/5ML syrup    Sig: Take 5 mLs by mouth 3 (three) times daily as needed for cough.    Dispense:  120 mL    Refill:  0    There are no discontinued medications.  Follow-up: No follow-ups on file.   Crecencio Mc, MD

## 2018-07-11 NOTE — Assessment & Plan Note (Signed)
Rapid flu test negative.  Symptoms present for 6 days.  Empiric treatment with abx,  Prednisone, cough suppressant  Probiotic advised

## 2018-07-11 NOTE — Patient Instructions (Addendum)
Your flu test was negative.  This is about 90% accurate in detecting influenza.   However, I am treating you for sinusitis/otitis which is a complication from your viral infection due to  persistent sinus congestion.   I am prescribing an antibiotic (augmentin) and a prednisone taper  To manage the infection and the inflammation in your ear/sinuses.   The prednisone taper should be taken as follows :    6 tablets all at onceIN THE MORNING  on Day 1,  Then 5 tablets on day 2  CONTINUE TO TAPER  by 1 tablet daily until gone  I also advise use of the following OTC meds to help with your other symptoms.   Take generic OTC benadryl 25 mg at bedtime for the drainage,  Sudafed PE  10 mg every 8 hours for the congestion, you may substitute Afrin nasal spray for the nighttime dose of sudafed PE  If needed to prevent insomnia.  flush your sinuses twice daily with  A NETTIE POT, or Neil Med's sinus rinse   (do this over the sink and keep lots of tissues handy  because if you do it right you will blow or spit out globs of mucus)  Use the cheratussin  For nighttime COUGH, and robitussin or delsym for the daytime cough.   Gargle with salt water as needed for sore throat.   Please take a probiotic ( Align, Floraque or Culturelle), the generic version of one of these over the counter medications, or an alternative form (kombucha,  Yogurt, or another dietary source) for a minimum of 3 weeks to prevent a serious antibiotic associated diarrhea  Called clostridium dificile colitis.

## 2018-07-20 ENCOUNTER — Ambulatory Visit: Payer: Self-pay | Admitting: Allergy

## 2018-07-25 ENCOUNTER — Ambulatory Visit: Payer: BLUE CROSS/BLUE SHIELD | Admitting: Allergy and Immunology

## 2018-07-25 ENCOUNTER — Encounter: Payer: Self-pay | Admitting: Allergy and Immunology

## 2018-07-25 VITALS — BP 124/70 | HR 83 | Resp 16 | Ht 68.0 in | Wt 155.2 lb

## 2018-07-25 DIAGNOSIS — J301 Allergic rhinitis due to pollen: Secondary | ICD-10-CM | POA: Diagnosis not present

## 2018-07-25 DIAGNOSIS — L989 Disorder of the skin and subcutaneous tissue, unspecified: Secondary | ICD-10-CM

## 2018-07-25 DIAGNOSIS — L308 Other specified dermatitis: Secondary | ICD-10-CM

## 2018-07-25 MED ORDER — TRIAMCINOLONE ACETONIDE 55 MCG/ACT NA AERO: INHALATION_SPRAY | NASAL | 5 refills | Status: DC

## 2018-07-25 MED ORDER — MOMETASONE FUROATE 0.1 % EX SOLN: Freq: Two times a day (BID) | CUTANEOUS | 3 refills | Status: DC | PRN
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
## 2018-07-25 NOTE — Patient Instructions (Addendum)
  1.  Allergen avoidance measures  2.  During "allergy season" can use OTC Nasacort 1 spray each nostril 3-7 times per week.  Take several days to work.  3.  Can continue Zyrtec 10 mg tablet 1 time per day if needed  4.  At onset of skin rash utilize the following:   A.  Mometasone 0.1% cream applied twice a day  B.  Zyrtec 10 mg tablet 1-2 times a day  5.  Further evaluation and treatment?  6.  Contact clinic if recurrent rash unresponsive to treatment

## 2018-07-26 ENCOUNTER — Encounter: Payer: Self-pay | Admitting: Allergy and Immunology

## 2018-07-26 ENCOUNTER — Telehealth: Payer: Self-pay | Admitting: Allergy and Immunology

## 2018-07-26 NOTE — Telephone Encounter (Signed)
Patient called is returning your call.

## 2018-08-14 DIAGNOSIS — E113212 Type 2 diabetes mellitus with mild nonproliferative diabetic retinopathy with macular edema, left eye: Secondary | ICD-10-CM | POA: Diagnosis not present

## 2018-09-14 ENCOUNTER — Ambulatory Visit: Payer: BLUE CROSS/BLUE SHIELD | Admitting: Internal Medicine

## 2018-09-14 ENCOUNTER — Encounter: Payer: Self-pay | Admitting: Internal Medicine

## 2018-09-14 VITALS — BP 110/70 | HR 74 | Temp 98.1°F | Resp 14 | Ht 68.0 in | Wt 154.0 lb

## 2018-09-14 DIAGNOSIS — IMO0002 Reserved for concepts with insufficient information to code with codable children: Secondary | ICD-10-CM

## 2018-09-14 DIAGNOSIS — E1165 Type 2 diabetes mellitus with hyperglycemia: Secondary | ICD-10-CM

## 2018-09-14 DIAGNOSIS — I10 Essential (primary) hypertension: Secondary | ICD-10-CM | POA: Diagnosis not present

## 2018-09-14 DIAGNOSIS — E785 Hyperlipidemia, unspecified: Secondary | ICD-10-CM

## 2018-09-14 DIAGNOSIS — E118 Type 2 diabetes mellitus with unspecified complications: Secondary | ICD-10-CM

## 2018-09-14 DIAGNOSIS — E1169 Type 2 diabetes mellitus with other specified complication: Secondary | ICD-10-CM

## 2018-09-14 LAB — COMPREHENSIVE METABOLIC PANEL
ALT: 33 U/L (ref 0–53)
AST: 25 U/L (ref 0–37)
Albumin: 4.3 g/dL (ref 3.5–5.2)
Alkaline Phosphatase: 71 U/L (ref 39–117)
BUN: 21 mg/dL (ref 6–23)
CO2: 34 mEq/L — ABNORMAL HIGH (ref 19–32)
Calcium: 9.9 mg/dL (ref 8.4–10.5)
Chloride: 101 mEq/L (ref 96–112)
Creatinine, Ser: 1.12 mg/dL (ref 0.40–1.50)
GFR: 66.99 mL/min (ref >60.00)
Glucose, Bld: 124 mg/dL — ABNORMAL HIGH (ref 70–99)
Potassium: 3.8 mEq/L (ref 3.5–5.1)
Sodium: 139 mEq/L (ref 135–145)
Total Bilirubin: 1 mg/dL (ref 0.2–1.2)
Total Protein: 7.7 g/dL (ref 6.0–8.3)

## 2018-09-14 LAB — LIPID PANEL
Cholesterol: 128 mg/dL (ref 0–200)
HDL: 41.3 mg/dL (ref >39.00)
LDL Cholesterol: 75 mg/dL (ref 0–99)
NonHDL: 86.59
Total CHOL/HDL Ratio: 3
Triglycerides: 59 mg/dL (ref 0.0–149.0)
VLDL: 11.8 mg/dL (ref 0.0–40.0)

## 2018-09-14 LAB — MICROALBUMIN / CREATININE URINE RATIO
Creatinine,U: 61.3 mg/dL
Microalb Creat Ratio: 1.1 mg/g (ref 0.0–30.0)
Microalb, Ur: <0.7 mg/dL (ref 0.0–1.9)

## 2018-09-14 LAB — HEMOGLOBIN A1C: HEMOGLOBIN A1C: 8.8 % — AB (ref 4.6–6.5)

## 2018-09-14 NOTE — Patient Instructions (Signed)
I will refill your Januvia at either 50 mg or 100 mg depending on the results of your labs today   Diabetes Mellitus and Nutrition, Adult When you have diabetes (diabetes mellitus), it is very important to have healthy eating habits because your blood sugar (glucose) levels are greatly affected by what you eat and drink. Eating healthy foods in the appropriate amounts, at about the same times every day, can help you:  Control your blood glucose.  Lower your risk of heart disease.  Improve your blood pressure.  Reach or maintain a healthy weight. Every person with diabetes is different, and each person has different needs for a meal plan. Your health care provider may recommend that you work with a diet and nutrition specialist (dietitian) to make a meal plan that is best for you. Your meal plan may vary depending on factors such as:  The calories you need.  The medicines you take.  Your weight.  Your blood glucose, blood pressure, and cholesterol levels.  Your activity level.  Other health conditions you have, such as heart or kidney disease. How do carbohydrates affect me? Carbohydrates, also called carbs, affect your blood glucose level more than any other type of food. Eating carbs naturally raises the amount of glucose in your blood. Carb counting is a method for keeping track of how many carbs you eat. Counting carbs is important to keep your blood glucose at a healthy level, especially if you use insulin or take certain oral diabetes medicines. It is important to know how many carbs you can safely have in each meal. This is different for every person. Your dietitian can help you calculate how many carbs you should have at each meal and for each snack. Foods that contain carbs include:  Bread, cereal, rice, pasta, and crackers.  Potatoes and corn.  Peas, beans, and lentils.  Milk and yogurt.  Fruit and juice.  Desserts, such as cakes, cookies, ice cream, and candy. How  does alcohol affect me? Alcohol can cause a sudden decrease in blood glucose (hypoglycemia), especially if you use insulin or take certain oral diabetes medicines. Hypoglycemia can be a life-threatening condition. Symptoms of hypoglycemia (sleepiness, dizziness, and confusion) are similar to symptoms of having too much alcohol. If your health care provider says that alcohol is safe for you, follow these guidelines:  Limit alcohol intake to no more than 1 drink per day for nonpregnant women and 2 drinks per day for men. One drink equals 12 oz of beer, 5 oz of wine, or 1 oz of hard liquor.  Do not drink on an empty stomach.  Keep yourself hydrated with water, diet soda, or unsweetened iced tea.  Keep in mind that regular soda, juice, and other mixers may contain a lot of sugar and must be counted as carbs. What are tips for following this plan?  Reading food labels  Start by checking the serving size on the "Nutrition Facts" label of packaged foods and drinks. The amount of calories, carbs, fats, and other nutrients listed on the label is based on one serving of the item. Many items contain more than one serving per package.  Check the total grams (g) of carbs in one serving. You can calculate the number of servings of carbs in one serving by dividing the total carbs by 15. For example, if a food has 30 g of total carbs, it would be equal to 2 servings of carbs.  Check the number of grams (g) of saturated  and trans fats in one serving. Choose foods that have low or no amount of these fats.  Check the number of milligrams (mg) of salt (sodium) in one serving. Most people should limit total sodium intake to less than 2,300 mg per day.  Always check the nutrition information of foods labeled as "low-fat" or "nonfat". These foods may be higher in added sugar or refined carbs and should be avoided.  Talk to your dietitian to identify your daily goals for nutrients listed on the  label. Shopping  Avoid buying canned, premade, or processed foods. These foods tend to be high in fat, sodium, and added sugar.  Shop around the outside edge of the grocery store. This includes fresh fruits and vegetables, bulk grains, fresh meats, and fresh dairy. Cooking  Use low-heat cooking methods, such as baking, instead of high-heat cooking methods like deep frying.  Cook using healthy oils, such as olive, canola, or sunflower oil.  Avoid cooking with butter, cream, or high-fat meats. Meal planning  Eat meals and snacks regularly, preferably at the same times every day. Avoid going long periods of time without eating.  Eat foods high in fiber, such as fresh fruits, vegetables, beans, and whole grains. Talk to your dietitian about how many servings of carbs you can eat at each meal.  Eat 4-6 ounces (oz) of lean protein each day, such as lean meat, chicken, fish, eggs, or tofu. One oz of lean protein is equal to: ? 1 oz of meat, chicken, or fish. ? 1 egg. ?  cup of tofu.  Eat some foods each day that contain healthy fats, such as avocado, nuts, seeds, and fish. Lifestyle  Check your blood glucose regularly.  Exercise regularly as told by your health care provider. This may include: ? 150 minutes of moderate-intensity or vigorous-intensity exercise each week. This could be brisk walking, biking, or water aerobics. ? Stretching and doing strength exercises, such as yoga or weightlifting, at least 2 times a week.  Take medicines as told by your health care provider.  Do not use any products that contain nicotine or tobacco, such as cigarettes and e-cigarettes. If you need help quitting, ask your health care provider.  Work with a Social worker or diabetes educator to identify strategies to manage stress and any emotional and social challenges. Questions to ask a health care provider  Do I need to meet with a diabetes educator?  Do I need to meet with a dietitian?  What  number can I call if I have questions?  When are the best times to check my blood glucose? Where to find more information:  American Diabetes Association: diabetes.org  Academy of Nutrition and Dietetics: www.eatright.[employer] of Diabetes and Digestive and Kidney Diseases (NIH): [employer] Summary  A healthy meal plan will help you control your blood glucose and maintain a healthy lifestyle.  Working with a diet and nutrition specialist (dietitian) can help you make a meal plan that is best for you.  Keep in mind that carbohydrates (carbs) and alcohol have immediate effects on your blood glucose levels. It is important to count carbs and to use alcohol carefully. This information is not intended to replace advice given to you by your health care provider. Make sure you discuss any questions you have with your health care provider. Document Released: 03/25/2005 Document Revised: 01/26/2017 Document Reviewed: 08/02/2016 Elsevier Interactive Patient Education  2019 [employer].

## 2018-09-14 NOTE — Progress Notes (Signed)
Subjective:  Patient ID: George Gordon, male    DOB: 1961/02/19  Age: 58 y.o. MRN: [id number]  CC: The primary encounter diagnosis was Hyperlipidemia due to type 2 diabetes mellitus ([place]). Diagnoses of Essential hypertension and Diabetes mellitus type 2, uncontrolled, with complications ([place]) were also pertinent to this visit.  HPI George Gordon presents for 3 month follow up on diabetes.  Patient has no complaints today.    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  No food allergies were demonstrated but multiple environmental allergies were noted.  Advised to use steroid nasal spray during allergy season and zyrtec all year round,  Given mometasone cream for prn use    Patient is following a low glycemic index diet.  Prescribed jardiance.   And [place] and tolerating both.  Glipizide was  stopped by Eula Fried  Due to recurrent hypoglycemia.  Fasting sugars have been under 140 most of the time . Diet reviewed.  No pretzels or potato chips.  occasional sweet potato fries.  NO regular ice cream or candy.   Patient is exercising occasionally but not regularly  And has no weight problem  .  Patient has had an eye exam in the last 12 months and checks feet regularly for signs of infection.  Patient does not walk barefoot outside,  And denies an numbness tingling or burning in feet. Patient is up to date on all recommended vaccinations  Taking 50 mg januvia daily but the tablets are 25 so he is running out   Lab Results  Component Value Date   HGBA1C 8.8 (H) 09/14/2018    Occupational stress .    Outpatient Medications Prior to Visit  Medication Sig Dispense Refill  . Cetirizine HCl (ZYRTEC ALLERGY) 10 MG CAPS Take 1 capsule by mouth daily.    . empagliflozin (JARDIANCE) 25 MG TABS tablet TAKE ONE TABLET EVERY DAY 30 tablet 2  . folic acid (FOLVITE) 683 MCG tablet Take 400 mcg by mouth daily. Reported on 07/25/2015    . glucose blood  test strip Please dispense Contour Next test strips to check Blood glucose twice a day, (90 day supply) E11.9 200 each 3  . hydrochlorothiazide (HYDRODIURIL) 25 MG tablet Take 25 mg by mouth daily.    Marland Kitchen losartan (COZAAR) 50 MG tablet   0  . mometasone (ELOCON) 0.1 % lotion Apply topically 2 (two) times daily as needed. 60 mL 3  . rosuvastatin (CRESTOR) 10 MG tablet Take 1 tablet (10 mg total) by mouth daily. 90 tablet 3  . vitamin C (ASCORBIC ACID) 500 MG tablet Take 500 mg by mouth daily. Reported on 07/25/2015    . vitamin E 400 UNIT capsule Take 400 Units by mouth daily.    . sitaGLIPtin (JANUVIA) 50 MG tablet Take 1 tablet (50 mg total) by mouth daily.    Marland Kitchen guaiFENesin-codeine (CHERATUSSIN AC) 100-10 MG/5ML syrup Take 5 mLs by mouth 3 (three) times daily as needed for cough. (Patient not taking: Reported on 07/25/2018) 120 mL 0  . triamcinolone (NASACORT) 55 MCG/ACT AERO nasal inhaler 1 spray each nostril 3-7 times per week (Patient not taking: Reported on 09/14/2018) 1 Bottle 5   No facility-administered medications prior to visit.     Review of Systems;  Patient denies headache, fevers, malaise, unintentional weight loss, skin rash, eye pain, sinus congestion and sinus pain, sore throat, dysphagia,  hemoptysis , cough, dyspnea, wheezing, chest pain, palpitations, orthopnea, edema, abdominal pain,  nausea, melena, diarrhea, constipation, flank pain, dysuria, hematuria, urinary  Frequency, nocturia, numbness, tingling, seizures,  Focal weakness, Loss of consciousness,  Tremor, insomnia, depression, anxiety, and suicidal ideation.      Objective:  BP 110/70 (BP Location: Left Arm, Patient Position: Sitting, Cuff Size: Normal)   Pulse 74   Temp 98.1 F (36.7 C) (Oral)   Resp 14   Ht 5\' 8"  (1.727 m)   Wt 154 lb (69.9 kg)   SpO2 95%   BMI 23.42 kg/m   BP Readings from Last 3 Encounters:  09/14/18 110/70  07/25/18 124/70  07/11/18 130/76    Wt Readings from Last 3 Encounters:    09/14/18 154 lb (69.9 kg)  07/25/18 155 lb 3.2 oz (70.4 kg)  07/11/18 159 lb (72.1 kg)    General appearance: alert, cooperative and appears stated age Ears: normal TM's and external ear canals both ears Throat: lips, mucosa, and tongue normal; teeth and gums normal Neck: no adenopathy, no carotid bruit, supple, symmetrical, trachea midline and thyroid not enlarged, symmetric, no tenderness/mass/nodules Back: symmetric, no curvature. ROM normal. No CVA tenderness. Lungs: clear to auscultation bilaterally Heart: regular rate and rhythm, S1, S2 normal, no murmur, click, rub or gallop Abdomen: soft, non-tender; bowel sounds normal; no masses,  no organomegaly Pulses: 2+ and symmetric Skin: Skin color, texture, turgor normal. No rashes or lesions Lymph nodes: Cervical, supraclavicular, and axillary nodes normal.  Lab Results  Component Value Date   HGBA1C 8.8 (H) 09/14/2018   HGBA1C 10.1 (H) 03/28/2018   HGBA1C 9.1 (H) 12/26/2017    Lab Results  Component Value Date   CREATININE 1.12 09/14/2018   CREATININE 1.12 07/10/2018   CREATININE 1.10 06/05/2018    Lab Results  Component Value Date   WBC 4.1 06/05/2018   HGB 16.9 06/05/2018   HCT 51.1 06/05/2018   PLT 147.0 (L) 06/05/2018   GLUCOSE 124 (H) 09/14/2018   CHOL 128 09/14/2018   TRIG 59.0 09/14/2018   HDL 41.30 09/14/2018   LDLDIRECT 214.0 08/05/2016   LDLCALC 75 09/14/2018   ALT 33 09/14/2018   AST 25 09/14/2018   NA 139 09/14/2018   K 3.8 09/14/2018   CL 101 09/14/2018   CREATININE 1.12 09/14/2018   BUN 21 09/14/2018   CO2 34 (H) 09/14/2018   TSH 0.84 09/21/2017   HGBA1C 8.8 (H) 09/14/2018   MICROALBUR <0.7 09/14/2018    No results found.  Assessment & Plan:   Problem List Items Addressed This Visit    Diabetes mellitus type 2, uncontrolled, with complications ([place])    Remains uncontrolled on Januvia 50 mg and Jardiance. Will increase januvia to 100 mg daily  And continue Jardiance.  Encouraged to  exercise.    Lab Results  Component Value Date   HGBA1C 8.8 (H) 09/14/2018         Relevant Medications   sitaGLIPtin (JANUVIA) 100 MG tablet   Other Relevant Orders   Microalbumin / creatinine urine ratio (Completed)   Hemoglobin A1c (Completed)   Hyperlipidemia due to type 2 diabetes mellitus (HCC) - Primary    High intensity statin indicated d/t baseline ASCVD risk >20% - Continue rosuvastatin 10 mg daily. Lab Results  Component Value Date   CHOL 128 09/14/2018   HDL 41.30 09/14/2018   LDLCALC 75 09/14/2018   LDLDIRECT 214.0 08/05/2016   TRIG 59.0 09/14/2018   CHOLHDL 3 09/14/2018    - Lipid panel today      Relevant Medications   sitaGLIPtin (JANUVIA)  100 MG tablet   Other Relevant Orders   Lipid panel (Completed)   Essential hypertension   Relevant Orders   Comprehensive metabolic panel (Completed)    A total of 25 minutes of face to face time was spent with patient more than half of which was spent in counselling about the above mentioned conditions  and coordination of care   I have discontinued Suhaan Worrel's guaiFENesin-codeine and triamcinolone. I have also changed his sitaGLIPtin. Additionally, I am having him maintain his vitamin C, folic acid, vitamin E, glucose blood, Cetirizine HCl, losartan, rosuvastatin, empagliflozin, hydrochlorothiazide, and mometasone.  Meds ordered this encounter  Medications  . sitaGLIPtin (JANUVIA) 100 MG tablet    Sig: Take 1 tablet (100 mg total) by mouth daily.    Dispense:  90 tablet    Refill:  1    Medications Discontinued During This Encounter  Medication Reason  . guaiFENesin-codeine (CHERATUSSIN AC) 100-10 MG/5ML syrup Completed Course  . triamcinolone (NASACORT) 55 MCG/ACT AERO nasal inhaler Patient has not taken in last 30 days  . sitaGLIPtin (JANUVIA) 50 MG tablet     Follow-up: Return in about 3 months (around 12/15/2018) for follow up diabetes.   Crecencio Mc, MD

## 2018-09-16 MED ORDER — SITAGLIPTIN PHOSPHATE 100 MG PO TABS: 100.0000 mg | ORAL_TABLET | Freq: Every day | ORAL | 1 refills | Status: DC

## 2018-09-16 NOTE — Assessment & Plan Note (Addendum)
Remains uncontrolled on Januvia 50 mg and Jardiance. Will increase januvia to 100 mg daily  And continue Jardiance.  Encouraged to exercise.    Lab Results  Component Value Date   HGBA1C 8.8 (H) 09/14/2018

## 2018-09-16 NOTE — Assessment & Plan Note (Signed)
High intensity statin indicated d/t baseline ASCVD risk >20% - Continue rosuvastatin 10 mg daily. Lab Results  Component Value Date   CHOL 128 09/14/2018   HDL 41.30 09/14/2018   LDLCALC 75 09/14/2018   LDLDIRECT 214.0 08/05/2016   TRIG 59.0 09/14/2018   CHOLHDL 3 09/14/2018    - Lipid panel today

## 2018-10-17 ENCOUNTER — Other Ambulatory Visit: Payer: Self-pay | Admitting: Internal Medicine

## 2018-10-17 DIAGNOSIS — IMO0002 Reserved for concepts with insufficient information to code with codable children: Secondary | ICD-10-CM

## 2018-10-17 DIAGNOSIS — E1165 Type 2 diabetes mellitus with hyperglycemia: Secondary | ICD-10-CM

## 2018-10-17 DIAGNOSIS — E118 Type 2 diabetes mellitus with unspecified complications: Principal | ICD-10-CM

## 2018-10-17 MED ORDER — EMPAGLIFLOZIN 25 MG PO TABS: 25.0000 mg | ORAL_TABLET | Freq: Every day | ORAL | 2 refills | Status: DC

## 2018-12-14 ENCOUNTER — Telehealth: Payer: Self-pay | Admitting: Internal Medicine

## 2018-12-15 ENCOUNTER — Ambulatory Visit: Payer: BLUE CROSS/BLUE SHIELD | Admitting: Internal Medicine

## 2019-01-22 ENCOUNTER — Encounter: Payer: Self-pay | Admitting: Internal Medicine

## 2019-01-22 ENCOUNTER — Other Ambulatory Visit (INDEPENDENT_AMBULATORY_CARE_PROVIDER_SITE_OTHER): Payer: BC Managed Care – PPO

## 2019-01-22 ENCOUNTER — Other Ambulatory Visit: Payer: Self-pay

## 2019-01-22 ENCOUNTER — Ambulatory Visit (INDEPENDENT_AMBULATORY_CARE_PROVIDER_SITE_OTHER): Payer: BC Managed Care – PPO | Admitting: Internal Medicine

## 2019-01-22 DIAGNOSIS — I10 Essential (primary) hypertension: Secondary | ICD-10-CM | POA: Diagnosis not present

## 2019-01-22 DIAGNOSIS — E118 Type 2 diabetes mellitus with unspecified complications: Secondary | ICD-10-CM | POA: Diagnosis not present

## 2019-01-22 DIAGNOSIS — R51 Headache: Secondary | ICD-10-CM | POA: Diagnosis not present

## 2019-01-22 DIAGNOSIS — I6930 Unspecified sequelae of cerebral infarction: Secondary | ICD-10-CM | POA: Diagnosis not present

## 2019-01-22 DIAGNOSIS — E1165 Type 2 diabetes mellitus with hyperglycemia: Secondary | ICD-10-CM

## 2019-01-22 DIAGNOSIS — G44321 Chronic post-traumatic headache, intractable: Secondary | ICD-10-CM

## 2019-01-22 DIAGNOSIS — IMO0002 Reserved for concepts with insufficient information to code with codable children: Secondary | ICD-10-CM

## 2019-01-22 DIAGNOSIS — R519 Headache, unspecified: Secondary | ICD-10-CM

## 2019-01-22 DIAGNOSIS — E785 Hyperlipidemia, unspecified: Secondary | ICD-10-CM

## 2019-01-22 DIAGNOSIS — Z7189 Other specified counseling: Secondary | ICD-10-CM

## 2019-01-22 DIAGNOSIS — E1169 Type 2 diabetes mellitus with other specified complication: Secondary | ICD-10-CM

## 2019-01-22 MED ORDER — SITAGLIPTIN PHOSPHATE 100 MG PO TABS: 100.0000 mg | ORAL_TABLET | Freq: Every day | ORAL | 1 refills | Status: DC

## 2019-01-22 MED ORDER — EMPAGLIFLOZIN 25 MG PO TABS: 25.0000 mg | ORAL_TABLET | Freq: Every day | ORAL | 1 refills | Status: DC

## 2019-01-22 NOTE — Progress Notes (Addendum)
Virtual Visit via [email or web address]  This visit type was conducted due to national recommendations for restrictions regarding the COVID-19 pandemic (e.g. social distancing).  This format is felt to be most appropriate for this patient at this time.  All issues noted in this document were discussed and addressed.  No physical exam was performed (except for noted visual exam findings with Video Visits).   I connected with@ on 01/22/19 at 10:30 AM EDT by a video enabled telemedicine application or telephone and verified that I am speaking with the correct person using two identifiers. Location patient: home Location provider: work or home office Persons participating in the virtual visit: patient, provider  I discussed the limitations, risks, security and privacy concerns of performing an evaluation and management service by telephone and the availability of in person appointments. I also discussed with the patient that there may be a patient responsible charge related to this service. The patient expressed understanding and agreed to proceed.  Reason for visit:   Follow up o type 2 DM  HPI:  58 yr old male with T2DM  Poorly controlled last seen in March.  At  that time his Januvia dose was  inreased to 100 mg for a1c of 8.8   Blood sugars mostly under 120 ,  except for last week  fastings were  150's due to eating late at night. Post prandial blood sugars  Not checked  Hypertension: patient checks blood pressure twice weekly at home.  Readings have been for the most part < 140/80 at rest . Patient is following a reduce salt diet most days and is taking medications as prescribed  The patient has no signs or symptoms of COVID 19 infection (fever, cough, sore throat  or shortness of breath beyond what is typical for patient).  Patient denies contact with other persons with the above mentioned symptoms or with anyone confirmed to have COVID 19 .  He has lost his job as a result of [place] related layoffs.  He is  spending more time with his young son  But feels financially pressure by the loss of his annual income of $75K.  His wife is an employed college professor at [employer]: right sided  Headache HAS BEEN PRESENT FOR TWO MONTHS,  RESOLVED INiTIALLY BUT NOW RECURRENT  Described as throbbing, alternating with sharp pains .  Not positional.  The pain radiates to his right ear and is now accompanied by tinnitus.  He denies any vision changes,  And denies nausea.  h e has had a recent ophthalmologic eye exam    ROS: See pertinent positives and negatives per HPI.  Past Medical History:  Diagnosis Date  . Diabetes mellitus without complication ([place])   . Hyperlipidemia   . Hypertension     Past Surgical History:  Procedure Laterality Date  . CATARACT EXTRACTION W/PHACO Right 12/21/2016   Procedure: CATARACT EXTRACTION PHACO AND INTRAOCULAR LENS PLACEMENT (IOC);  Surgeon: Birder Robson, MD;  Location: [facility] ORS;  Service: Ophthalmology;  Laterality: Right;  [place] 00:27 AP% 11.3 CDE 3.08 fluid pack lot # [id number]  . CATARACT EXTRACTION W/PHACO Left 01/11/2017   Procedure: CATARACT EXTRACTION PHACO AND INTRAOCULAR LENS PLACEMENT (IOC);  Surgeon: Birder Robson, MD;  Location: [facility] ORS;  Service: Ophthalmology;  Laterality: Left;  [place]  00:30 AP% 13.0 CDE 3.93 Fluid pack lot # [id number]  . COLONOSCOPY    . NO PAST SURGERIES      Family History  Problem Relation Age of  Onset  . Diabetes Brother     SOCIAL HX:  reports that he has never smoked. He has never used smokeless tobacco. He reports current alcohol use. He reports that he does not use drugs.   Current Outpatient Medications:  .  Cetirizine HCl (ZYRTEC ALLERGY) 10 MG CAPS, Take 1 capsule by mouth daily., Disp: , Rfl:  .  empagliflozin (JARDIANCE) 25 MG TABS tablet, Take 25 mg by mouth daily., Disp: 90 tablet, Rfl: 1 .  folic acid (FOLVITE) 824 MCG tablet, Take 400 mcg by mouth daily. Reported on 07/25/2015, Disp: , Rfl:  .   glucose blood test strip, Please dispense Contour Next test strips to check Blood glucose twice a day, (90 day supply) E11.9, Disp: 200 each, Rfl: 3 .  mometasone (ELOCON) 0.1 % lotion, Apply topically 2 (two) times daily as needed., Disp: 60 mL, Rfl: 3 .  rosuvastatin (CRESTOR) 10 MG tablet, Take 1 tablet (10 mg total) by mouth daily., Disp: 90 tablet, Rfl: 3 .  sitaGLIPtin (JANUVIA) 100 MG tablet, Take 1 tablet (100 mg total) by mouth daily., Disp: 90 tablet, Rfl: 1 .  vitamin C (ASCORBIC ACID) 500 MG tablet, Take 500 mg by mouth daily. Reported on 07/25/2015, Disp: , Rfl:  .  vitamin E 400 UNIT capsule, Take 400 Units by mouth daily., Disp: , Rfl:   EXAM:  VITALS per patient if applicable:  GENERAL: alert, oriented, appears well and in no acute distress  HEENT: atraumatic, conjunttiva clear, no obvious abnormalities on inspection of external nose and ears  NECK: normal movements of the head and neck  LUNGS: on inspection no signs of respiratory distress, breathing rate appears normal, no obvious gross SOB, gasping or wheezing  CV: no obvious cyanosis  MS: moves all visible extremities without noticeable abnormality  PSYCH/NEURO: pleasant and cooperative, no obvious depression or anxiety, speech and thought processing grossly intact  ASSESSMENT AND PLAN:  Discussed the following assessment and plan:   Diabetes mellitus type 2, uncontrolled, with complications Improved control with 100 mg dose of januvia.  a1c is pending,  and he is advised to check post pandial sugars one daily .  continue Jardiance.  Encouraged to exercise.       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  .  Advised to continue suspension for now   Hyperlipidemia due to type 2 diabetes mellitus ([place]) High intensity statin indicated d/t  baseline ASCVD risk >20% ANMRI changes suggestive of remote thalamic infarcts.  He is tolerating rosuvastatin 10 mg daily. Lab Results  Component Value Date   CHOL 128 09/14/2018   HDL 41.30 09/14/2018   LDLCALC 75 09/14/2018   LDLDIRECT 214.0 08/05/2016   TRIG 59.0 09/14/2018   CHOLHDL 3 09/14/2018     Right-sided headache Unclear if current 2 month history of headache is sequelae of prior right sided facial trauma or new onset concerning for vasculitis.  He has tinnitus but no vision changes.  ESR ordered,  If positive he will need referral to [facility] for temporal artery biopsy .  If negative he will need an MRI with IACs to rule out vestibular schwannoma.   Educated About Covid-19 Virus Infection Educated patient on the newly broadened list of signs and symptoms of COVID-19 infection and ways to avoid the viral infection including washing hands frequently with soap and  water,  using hand sanitizer if unable to wash, avoiding touching face,  staying at home and limiting visitors,  and avoiding contact with people coming in and out of home.  Discussed the potential ineffectiveness of hand sanitizer if left in environments > 110 degrees (ie , the car).  Reminded patient to call office with questions/concerns.  The importance of continued social distancing was discussed today . Patient was screened for the development of any unsafe behaviors or habits that may have developed as a result of the social impact of the virus , including alcohol abuse,  Domestic violence, tobacco abuse and overeating.       I discussed the assessment and treatment plan with the patient. The patient was provided an opportunity to ask questions and all were answered. The patient agreed with the plan and demonstrated an understanding of the instructions.   The patient was advised to call back or seek an in-person evaluation if the symptoms worsen or if the condition fails to improve as anticipated.  I provided 40 minutes  of non-face-to-face time during this encounter.   Crecencio Mc, MD

## 2019-01-23 DIAGNOSIS — Z7189 Other specified counseling: Secondary | ICD-10-CM | POA: Insufficient documentation

## 2019-01-23 DIAGNOSIS — R519 Headache, unspecified: Secondary | ICD-10-CM | POA: Insufficient documentation

## 2019-01-23 LAB — CBC WITH DIFFERENTIAL/PLATELET
Basophils Absolute: 0.1 10*3/uL (ref 0.0–0.1)
Basophils Relative: 0.9 % (ref 0.0–3.0)
Eosinophils Absolute: 0 10*3/uL (ref 0.0–0.7)
Eosinophils Relative: 0.7 % (ref 0.0–5.0)
HCT: 48.6 % (ref 39.0–52.0)
Hemoglobin: 16.1 g/dL (ref 13.0–17.0)
Lymphocytes Relative: 38.2 % (ref 12.0–46.0)
Lymphs Abs: 2.4 10*3/uL (ref 0.7–4.0)
MCHC: 33.2 g/dL (ref 30.0–36.0)
MCV: 92.1 fl (ref 78.0–100.0)
Monocytes Absolute: 0.4 10*3/uL (ref 0.1–1.0)
Monocytes Relative: 7.2 % (ref 3.0–12.0)
Neutro Abs: 3.3 10*3/uL (ref 1.4–7.7)
Neutrophils Relative %: 53 % (ref 43.0–77.0)
Platelets: 169 10*3/uL (ref 150.0–400.0)
RBC: 5.27 Mil/uL (ref 4.22–5.81)
RDW: 14.4 % (ref 11.5–15.5)
WBC: 6.2 10*3/uL (ref 4.0–10.5)

## 2019-01-23 LAB — SEDIMENTATION RATE: Sed Rate: 16 mm/hr (ref 0–20)

## 2019-01-23 LAB — COMPREHENSIVE METABOLIC PANEL
ALT: 23 U/L (ref 0–53)
AST: 18 U/L (ref 0–37)
Albumin: 4.2 g/dL (ref 3.5–5.2)
Alkaline Phosphatase: 61 U/L (ref 39–117)
BUN: 19 mg/dL (ref 6–23)
CO2: 34 mEq/L — ABNORMAL HIGH (ref 19–32)
Calcium: 9.1 mg/dL (ref 8.4–10.5)
Chloride: 99 mEq/L (ref 96–112)
Creatinine, Ser: 1.14 mg/dL (ref 0.40–1.50)
GFR: 65.56 mL/min (ref >60.00)
Glucose, Bld: 173 mg/dL — ABNORMAL HIGH (ref 70–99)
Potassium: 3.7 mEq/L (ref 3.5–5.1)
Sodium: 137 mEq/L (ref 135–145)
Total Bilirubin: 0.8 mg/dL (ref 0.2–1.2)
Total Protein: 6.9 g/dL (ref 6.0–8.3)

## 2019-01-23 LAB — HEMOGLOBIN A1C: Hgb A1c MFr Bld: 7.8 % — ABNORMAL HIGH (ref 4.6–6.5)

## 2019-01-23 MED ORDER — EMPAGLIFLOZIN 25 MG PO TABS: 25.0000 mg | ORAL_TABLET | Freq: Every day | ORAL | 1 refills | Status: DC

## 2019-01-23 NOTE — Assessment & Plan Note (Signed)
Educated patient on the newly broadened list of signs and symptoms of COVID-19 infection and ways to avoid the viral infection including washing hands frequently with soap and water,  using hand sanitizer if unable to wash, avoiding touching face,  staying at home and limiting visitors,  and avoiding contact with people coming in and out of home.  Discussed the potential ineffectiveness of hand sanitizer if left in environments > 110 degrees (ie , the car).  Reminded patient to call office with questions/concerns.  The importance of continued social distancing was discussed today . Patient was screened for the development of any unsafe behaviors or habits that may have developed as a result of the social impact of the virus , including alcohol abuse,  Domestic violence, tobacco abuse and overeating.

## 2019-01-23 NOTE — Assessment & Plan Note (Signed)
Unclear if current 2 month history of headache is sequelae of prior right sided facial trauma or new onset concerning for vasculitis.  He has tinnitus but no vision changes.  ESR ordered,  If positive he will need referral to [facility] for temporal artery biopsy .  If negative he will need an MRI with IACs to rule out vestibular schwannoma.

## 2019-01-23 NOTE — Assessment & Plan Note (Signed)
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

## 2019-01-23 NOTE — Assessment & Plan Note (Addendum)
High intensity statin indicated d/t baseline ASCVD risk >20% ANMRI changes suggestive of remote thalamic infarcts.  He is tolerating rosuvastatin 10 mg daily. Lab Results  Component Value Date   CHOL 128 09/14/2018   HDL 41.30 09/14/2018   LDLCALC 75 09/14/2018   LDLDIRECT 214.0 08/05/2016   TRIG 59.0 09/14/2018   CHOLHDL 3 09/14/2018

## 2019-01-23 NOTE — Assessment & Plan Note (Signed)
His BP remains at goal without losartan based on home checks.  .  Advised to continue suspension for now

## 2019-01-23 NOTE — Assessment & Plan Note (Signed)
Improved control with 100 mg dose of januvia.  a1c is pending,  and he is advised to check post pandial sugars one daily .  continue Jardiance.  Encouraged to exercise.

## 2019-01-24 NOTE — Progress Notes (Signed)
Mri

## 2019-01-24 NOTE — Addendum Note (Signed)
Addended by: Crecencio Mc on: 01/24/2019 11:43 PM   Modules accepted: Orders

## 2019-02-04 ENCOUNTER — Ambulatory Visit
Admission: RE | Admit: 2019-02-04 | Discharge: 2019-02-04 | Disposition: A | Discharge status: Home / Self Care | Payer: BC Managed Care – PPO | Source: Ambulatory Visit | Attending: Internal Medicine | Admitting: Internal Medicine

## 2019-02-04 ENCOUNTER — Other Ambulatory Visit: Payer: Self-pay

## 2019-02-04 DIAGNOSIS — G44321 Chronic post-traumatic headache, intractable: Secondary | ICD-10-CM | POA: Diagnosis not present

## 2019-02-04 DIAGNOSIS — R51 Headache: Secondary | ICD-10-CM | POA: Insufficient documentation

## 2019-02-04 DIAGNOSIS — R519 Headache, unspecified: Secondary | ICD-10-CM

## 2019-03-28 ENCOUNTER — Other Ambulatory Visit: Payer: Self-pay

## 2019-03-28 ENCOUNTER — Ambulatory Visit (INDEPENDENT_AMBULATORY_CARE_PROVIDER_SITE_OTHER): Payer: BC Managed Care – PPO

## 2019-03-28 DIAGNOSIS — Z23 Encounter for immunization: Secondary | ICD-10-CM | POA: Diagnosis not present

## 2019-04-02 ENCOUNTER — Telehealth: Payer: Self-pay | Admitting: Internal Medicine

## 2019-04-02 NOTE — Telephone Encounter (Signed)
Pt request refill   hydrochlorothiazide (HYDRODIURIL) 25 MG tablet    Losartan 100 mg (used to be prescribed together prior to manufacturer backorder.)  Cetirizine HCl (ZYRTEC ALLERGY) 10 MG CAPS gel ( not tablets)  90 day  [id number] [address], [place] (Phone) [phone] (Fax)

## 2019-04-04 ENCOUNTER — Telehealth: Payer: Self-pay | Admitting: Internal Medicine

## 2019-04-04 MED ORDER — GLUCOSE BLOOD VI STRP: ORAL_STRIP | 3 refills | Status: DC

## 2019-04-04 NOTE — Telephone Encounter (Signed)
See encounter below,patient is requesting strips not in current med list.

## 2019-04-04 NOTE — Telephone Encounter (Signed)
Medication Refill - Medication:  BAYER CONTOUR NEXT TEST test strip [id number]  Has the patient contacted their pharmacy? Yes advised to call office.   Preferred Pharmacy (with phone number or street name):  [id number] [address], [place] [phone] (Phone) [phone] (Fax)   Agent: Please be advised that RX refills may take up to 3 business days. We ask that you follow-up with your pharmacy.

## 2019-04-04 NOTE — Telephone Encounter (Signed)
Test strips have been refilled.

## 2019-04-09 MED ORDER — ZYRTEC ALLERGY 10 MG PO CAPS: 1.0000 | ORAL_CAPSULE | Freq: Every day | ORAL | 3 refills | Status: AC

## 2019-04-09 NOTE — Telephone Encounter (Signed)
Losartan and HCTZ are not in the patients current medication list.

## 2019-04-09 NOTE — Addendum Note (Signed)
Addended by: Adair Laundry on: 04/09/2019 02:42 PM   Modules accepted: Orders

## 2019-04-09 NOTE — Telephone Encounter (Signed)
Pt following up on refill request for the following sent on 04/02/19. Requesting CB as soon as possible with follow up.   hydrochlorothiazide (HYDRODIURIL) 25 MG tablet    Losartan 100 mg (used to be prescribed together prior to manufacturer backorder.)  Cetirizine HCl (ZYRTEC ALLERGY) 10 MG CAPS gel ( not tablets)  90 day  [id number] [address], [place] (Phone) [phone] (Fax)

## 2019-04-09 NOTE — Telephone Encounter (Signed)
Do not refill.   MyChart message sent re blood pressure

## 2019-04-19 ENCOUNTER — Other Ambulatory Visit: Payer: Self-pay

## 2019-04-19 DIAGNOSIS — Z20822 Contact with and (suspected) exposure to covid-19: Secondary | ICD-10-CM

## 2019-04-20 LAB — NOVEL CORONAVIRUS, NAA: SARS-CoV-2, NAA: NOT DETECTED

## 2019-04-26 ENCOUNTER — Other Ambulatory Visit: Payer: Self-pay

## 2019-04-26 ENCOUNTER — Encounter: Payer: Self-pay | Admitting: Internal Medicine

## 2019-04-26 ENCOUNTER — Ambulatory Visit (INDEPENDENT_AMBULATORY_CARE_PROVIDER_SITE_OTHER): Payer: BC Managed Care – PPO | Admitting: Internal Medicine

## 2019-04-26 VITALS — Ht 68.0 in | Wt 150.0 lb

## 2019-04-26 DIAGNOSIS — R339 Retention of urine, unspecified: Secondary | ICD-10-CM | POA: Diagnosis not present

## 2019-04-26 DIAGNOSIS — E118 Type 2 diabetes mellitus with unspecified complications: Secondary | ICD-10-CM | POA: Diagnosis not present

## 2019-04-26 DIAGNOSIS — B349 Viral infection, unspecified: Secondary | ICD-10-CM | POA: Diagnosis not present

## 2019-04-26 DIAGNOSIS — Z7189 Other specified counseling: Secondary | ICD-10-CM

## 2019-04-26 DIAGNOSIS — R1084 Generalized abdominal pain: Secondary | ICD-10-CM | POA: Diagnosis not present

## 2019-04-26 DIAGNOSIS — R3911 Hesitancy of micturition: Secondary | ICD-10-CM | POA: Insufficient documentation

## 2019-04-26 DIAGNOSIS — R109 Unspecified abdominal pain: Secondary | ICD-10-CM | POA: Insufficient documentation

## 2019-04-26 DIAGNOSIS — E1165 Type 2 diabetes mellitus with hyperglycemia: Secondary | ICD-10-CM

## 2019-04-26 DIAGNOSIS — IMO0002 Reserved for concepts with insufficient information to code with codable children: Secondary | ICD-10-CM

## 2019-04-26 MED ORDER — PANTOPRAZOLE SODIUM 40 MG PO TBEC: 40.0000 mg | DELAYED_RELEASE_TABLET | Freq: Every day | ORAL | 3 refills | Status: DC

## 2019-04-26 MED ORDER — TAMSULOSIN HCL 0.4 MG PO CAPS: 0.4000 mg | ORAL_CAPSULE | Freq: Every day | ORAL | 3 refills | Status: DC

## 2019-04-26 NOTE — Assessment & Plan Note (Signed)
Adding Flomax as a trial.  Post void residual if symptoms do not improve

## 2019-04-26 NOTE — Patient Instructions (Signed)
Protonix and Flomax sent to pharmacy   Take the protonix  in the am on an empty stomach .  Take the flomax at night.    Buy gas x or phasyme and take for the abdominal  pain which may be due to gas  Reduce evening dose of glipizide to 5 mg if the fasting sugars start to drop to < 90

## 2019-04-26 NOTE — Progress Notes (Signed)
Virtual Visit via [email or web address]  This visit type was conducted due to national recommendations for restrictions regarding the COVID-19 pandemic (e.g. social distancing).  This format is felt to be most appropriate for this patient at this time.  All issues noted in this document were discussed and addressed.  No physical exam was performed (except for noted visual exam findings with Video Visits).   I connected with@ on 04/26/19 at  8:00 AM EDT by a video enabled telemedicine application and verified that I am speaking with the correct person using two identifiers. Location patient: home Location provider: work or home office Persons participating in the virtual visit: patient, provider  I discussed the limitations, risks, security and privacy concerns of performing an evaluation and management service by telephone and the availability of in person appointments. I also discussed with the patient that there may be a patient responsible charge related to this service. The patient expressed understanding and agreed to proceed.  Reason for visit: URI symptoms, urinary symptoms ,  Diabetes follow up .  HPI:  58 yr old male with uncontrolled type 2 DM.  Chronic headache disorder presents with persistent symptoms of headache,  mild sore throat  And stomach aches since oct 9. Tested on Oct 9 and was negative .  He had no known covid contacts or exposures,  Has an 73 month old child and wife is a profressor who is workin gfrom home No fevers,  Body aches or fatigue  Stomach ache is random and intermittent, described as stabbing ,  Lasting < 60 seconds,  An occur in any of the 4 quadrants  variably.  Has been having urinary hesitancy and slow flow.  No diarrhea or constipation, but some early satiety and weight gain of 2 lbs.    3 month follow up on diabetes. Patient is following a low glycemic index diet  About 50% of the time and taking all prescribed medications regularly without side effects.  Fasting sugars  have been under less than 140 most of the time and as low as 68.   post prandials have been under 160 except on rare occasions. Patient is exercising about 3 times per week and intentionally trying to lose weight .  Patient has had an eye exam in the last 12 months and checks feet regularly for signs of infection.  Patient does not walk barefoot outside,  Because he has chronic neuropathy causing  Numbness and  burning in feet. Patient is up to date on all recommended vaccinations   ROS: See pertinent positives and negatives per HPI.  Past Medical History:  Diagnosis Date  . Diabetes mellitus without complication ([place])   . Hyperlipidemia   . Hypertension     Past Surgical History:  Procedure Laterality Date  . CATARACT EXTRACTION W/PHACO Right 12/21/2016   Procedure: CATARACT EXTRACTION PHACO AND INTRAOCULAR LENS PLACEMENT (IOC);  Surgeon: Birder Robson, MD;  Location: [facility] ORS;  Service: Ophthalmology;  Laterality: Right;  [place] 00:27 AP% 11.3 CDE 3.08 fluid pack lot # [id number] H  . CATARACT EXTRACTION W/PHACO Left 01/11/2017   Procedure: CATARACT EXTRACTION PHACO AND INTRAOCULAR LENS PLACEMENT (IOC);  Surgeon: Birder Robson, MD;  Location: [facility];  Service: Ophthalmology;  Laterality: Left;  [place]  00:30 AP% 13.0 CDE 3.93 Fluid pack lot # [id number] H  . COLONOSCOPY    . NO PAST SURGERIES      Family History  Problem Relation Age of Onset  . Diabetes Brother     SOCIAL HX:  reports  that he has never smoked. He has never used smokeless tobacco. He reports current alcohol use. He reports that he does not use drugs.   Current Outpatient Medications:  .  Cetirizine HCl (ZYRTEC ALLERGY) 10 MG CAPS, Take 1 capsule (10 mg total) by mouth daily., Disp: 90 capsule, Rfl: 3 .  empagliflozin (JARDIANCE) 25 MG TABS tablet, Take 25 mg by mouth daily., Disp: 90 tablet, Rfl: 1 .  folic acid (FOLVITE) A999333 MCG tablet, Take 400 mcg by mouth daily. Reported on 07/25/2015, Disp: , Rfl:  .  glipiZIDE  (GLUCOTROL) 10 MG tablet, TAKE 1 TABLET BY MOUTH TWICE A DAY BEFORE MEALS, Disp: 180 tablet, Rfl: 1 .  glucose blood test strip, Please dispense Contour Next test strips to check Blood glucose twice a day, (90 day supply) E11.9, Disp: 200 each, Rfl: 3 .  rosuvastatin (CRESTOR) 10 MG tablet, Take 1 tablet (10 mg total) by mouth daily., Disp: 90 tablet, Rfl: 3 .  sitaGLIPtin (JANUVIA) 100 MG tablet, Take 1 tablet (100 mg total) by mouth daily., Disp: 90 tablet, Rfl: 1 .  vitamin C (ASCORBIC ACID) 500 MG tablet, Take 500 mg by mouth daily. Reported on 07/25/2015, Disp: , Rfl:  .  vitamin E 400 UNIT capsule, Take 400 Units by mouth daily., Disp: , Rfl:  .  mometasone (ELOCON) 0.1 % lotion, Apply topically 2 (two) times daily as needed. (Patient not taking: Reported on 04/26/2019), Disp: 60 mL, Rfl: 3 .  pantoprazole (PROTONIX) 40 MG tablet, Take 1 tablet (40 mg total) by mouth daily., Disp: 30 tablet, Rfl: 3 .  tamsulosin (FLOMAX) 0.4 MG CAPS capsule, Take 1 capsule (0.4 mg total) by mouth daily., Disp: 30 capsule, Rfl: 3  EXAM:  VITALS per patient if applicable:  GENERAL: alert, oriented, appears well and in no acute distress  HEENT: atraumatic, conjunttiva clear, no obvious abnormalities on inspection of external nose and ears  NECK: normal movements of the head and neck  LUNGS: on inspection no signs of respiratory distress, breathing rate appears normal, no obvious gross SOB, gasping or wheezing  CV: no obvious cyanosis  MS: moves all visible extremities without noticeable abnormality  PSYCH/NEURO: pleasant and cooperative, no obvious depression or anxiety, speech and thought processing grossly intact  ASSESSMENT AND PLAN:  Discussed the following assessment and plan:  Urinary retention - Plan: Urinalysis, Routine w reflex microscopic, PSA  Diabetes mellitus type 2, uncontrolled, with complications (HCC) - Plan: Hemoglobin A1c, Comprehensive metabolic panel  Viral infection - Plan:  CBC with Differential/Platelet  Generalized abdominal pain  Urinary hesitancy  Educated about COVID-19 virus infection  Abdominal pain etiology unclear given the random nature, variable location suggestive o f gas.  Will treat for gastritis and gas.    Urinary hesitancy Adding Flomax as a trial.  Post void residual if symptoms do not improve   Educated About Covid-19 Virus Infection Advised to remain in isolation for 10 days minimum despite negative COVID 19 infection  Diabetes mellitus type 2, uncontrolled, with complications Advised to reduce glipizide dose to 5 mg in the evening if fastings drop < 90     I discussed the assessment and treatment plan with the patient. The patient was provided an opportunity to ask questions and all were answered. The patient agreed with the plan and demonstrated an understanding of the instructions.   The patient was advised to call back or seek an in-person evaluation if the symptoms worsen or if the condition fails to improve as anticipated.  I provided  25 minutes of non-face-to-face time during this encounter reviewing patient's current problems,   Providing counseling on the above mentioned problems , and coordination  of care .  Crecencio Mc, MD   Crecencio Mc, MD

## 2019-04-26 NOTE — Assessment & Plan Note (Signed)
Advised to reduce glipizide dose to 5 mg in the evening if fastings drop < 90

## 2019-04-26 NOTE — Assessment & Plan Note (Signed)
etiology unclear given the random nature, variable location suggestive o f gas.  Will treat for gastritis and gas.

## 2019-04-26 NOTE — Assessment & Plan Note (Signed)
Advised to remain in isolation for 10 days minimum despite negative COVID 19 infection

## 2019-05-09 ENCOUNTER — Other Ambulatory Visit: Payer: Self-pay

## 2019-05-11 ENCOUNTER — Other Ambulatory Visit: Payer: Self-pay

## 2019-05-11 ENCOUNTER — Other Ambulatory Visit (INDEPENDENT_AMBULATORY_CARE_PROVIDER_SITE_OTHER): Payer: BC Managed Care – PPO

## 2019-05-11 DIAGNOSIS — R339 Retention of urine, unspecified: Secondary | ICD-10-CM

## 2019-05-11 DIAGNOSIS — E118 Type 2 diabetes mellitus with unspecified complications: Secondary | ICD-10-CM | POA: Diagnosis not present

## 2019-05-11 DIAGNOSIS — IMO0002 Reserved for concepts with insufficient information to code with codable children: Secondary | ICD-10-CM

## 2019-05-11 DIAGNOSIS — B349 Viral infection, unspecified: Secondary | ICD-10-CM

## 2019-05-11 DIAGNOSIS — E1165 Type 2 diabetes mellitus with hyperglycemia: Secondary | ICD-10-CM

## 2019-05-11 LAB — COMPREHENSIVE METABOLIC PANEL
ALT: 25 U/L (ref 0–53)
AST: 22 U/L (ref 0–37)
Albumin: 4.3 g/dL (ref 3.5–5.2)
Alkaline Phosphatase: 67 U/L (ref 39–117)
BUN: 14 mg/dL (ref 6–23)
CO2: 31 mEq/L (ref 19–32)
Calcium: 9.7 mg/dL (ref 8.4–10.5)
Chloride: 101 mEq/L (ref 96–112)
Creatinine, Ser: 1.14 mg/dL (ref 0.40–1.50)
GFR: 65.49 mL/min (ref >60.00)
Glucose, Bld: 131 mg/dL — ABNORMAL HIGH (ref 70–99)
Potassium: 4.3 mEq/L (ref 3.5–5.1)
Sodium: 140 mEq/L (ref 135–145)
Total Bilirubin: 0.8 mg/dL (ref 0.2–1.2)
Total Protein: 7.2 g/dL (ref 6.0–8.3)

## 2019-05-11 LAB — URINALYSIS, ROUTINE W REFLEX MICROSCOPIC
Bilirubin Urine: NEGATIVE
Hgb urine dipstick: NEGATIVE
Ketones, ur: NEGATIVE
Leukocytes,Ua: NEGATIVE
Nitrite: NEGATIVE
RBC / HPF: NONE SEEN (ref 0–?)
Specific Gravity, Urine: 1.015 (ref 1.000–1.030)
Total Protein, Urine: NEGATIVE
Urine Glucose: >=1000 — AB
Urobilinogen, UA: 0.2 (ref 0.0–1.0)
pH: 6.5 (ref 5.0–8.0)

## 2019-05-11 LAB — CBC WITH DIFFERENTIAL/PLATELET
Basophils Absolute: 0 10*3/uL (ref 0.0–0.1)
Basophils Relative: 0.3 % (ref 0.0–3.0)
Eosinophils Absolute: 0.1 10*3/uL (ref 0.0–0.7)
Eosinophils Relative: 1 % (ref 0.0–5.0)
HCT: 49.3 % (ref 39.0–52.0)
Hemoglobin: 16.3 g/dL (ref 13.0–17.0)
Lymphocytes Relative: 42.2 % (ref 12.0–46.0)
Lymphs Abs: 2.3 10*3/uL (ref 0.7–4.0)
MCHC: 33 g/dL (ref 30.0–36.0)
MCV: 92.2 fl (ref 78.0–100.0)
Monocytes Absolute: 0.5 10*3/uL (ref 0.1–1.0)
Monocytes Relative: 9 % (ref 3.0–12.0)
Neutro Abs: 2.6 10*3/uL (ref 1.4–7.7)
Neutrophils Relative %: 47.5 % (ref 43.0–77.0)
Platelets: 169 10*3/uL (ref 150.0–400.0)
RBC: 5.35 Mil/uL (ref 4.22–5.81)
RDW: 14.7 % (ref 11.5–15.5)
WBC: 5.5 10*3/uL (ref 4.0–10.5)

## 2019-05-11 LAB — PSA: PSA: 0.95 ng/mL (ref 0.10–4.00)

## 2019-05-11 LAB — HEMOGLOBIN A1C: Hgb A1c MFr Bld: 8.3 % — ABNORMAL HIGH (ref 4.6–6.5)

## 2019-05-15 ENCOUNTER — Encounter: Payer: Self-pay | Admitting: Internal Medicine

## 2019-05-15 ENCOUNTER — Other Ambulatory Visit: Payer: Self-pay

## 2019-05-15 ENCOUNTER — Ambulatory Visit: Payer: BC Managed Care – PPO | Admitting: Internal Medicine

## 2019-05-15 DIAGNOSIS — IMO0002 Reserved for concepts with insufficient information to code with codable children: Secondary | ICD-10-CM

## 2019-05-15 DIAGNOSIS — R519 Headache, unspecified: Secondary | ICD-10-CM

## 2019-05-15 DIAGNOSIS — E118 Type 2 diabetes mellitus with unspecified complications: Secondary | ICD-10-CM | POA: Diagnosis not present

## 2019-05-15 DIAGNOSIS — E1165 Type 2 diabetes mellitus with hyperglycemia: Secondary | ICD-10-CM | POA: Diagnosis not present

## 2019-05-15 DIAGNOSIS — R3911 Hesitancy of micturition: Secondary | ICD-10-CM | POA: Diagnosis not present

## 2019-05-15 MED ORDER — ATENOLOL 25 MG PO TABS: 25.0000 mg | ORAL_TABLET | Freq: Every day | ORAL | 3 refills | Status: DC

## 2019-05-15 NOTE — Assessment & Plan Note (Signed)
a1c elevated despite normal fasting glucose.  Due to dietary indiscretions Advised to check 2 hr post prandials,  s.

## 2019-05-15 NOTE — Assessment & Plan Note (Signed)
improved with flomax

## 2019-05-15 NOTE — Progress Notes (Signed)
Subjective:  Patient ID: George Gordon, male    DOB: 02/13/1961  Age: 58 y.o. MRN: [id number]  CC: Diagnoses of Diabetes mellitus type 2, uncontrolled, with complications ([place]), Urinary hesitancy, and Right-sided headache were pertinent to this visit.  HPI Layth Urbieta presents for follow up on type 2 diabetes   Recent A1c had risen from 7.8 to 8.3 Medications:  taking jardiance 25 mg ,  glipizide 10 mg bid januvia 100 mg daily  Checking morning fastigs:  126, 105, 90 , and 93  The post prandials have not been checked,  He has been eatin gwild rice daily and sometimes twice daily for the past 2 months becuas  His son likes it.  Also eating plantains   Seen 3 weeks ago for abd pain , urinary hesitancy , covid 19 symptoms with negative test ,   flomax is helpig with the urinary hesitancy  The abd pain has improved,  But continues to occur intermittently , now for the past 6 weeks   Averaging 4 days per week, can last anywhere from 4 hours  Or less,  Occurs randomly,  Does not think it is gas  despite taking pantoprazole for the past 3 weeks.  Feels full all the time.  Denies nausea . Has a BM daily,  No straining    Seen in July for persistent headache.  MRI done and neurology referral made but deferred .  .still having . 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 Cetirizine HCl (ZYRTEC ALLERGY) 10 MG CAPS Take 1 capsule (10 mg total) by mouth daily. 90 capsule 3  . empagliflozin (JARDIANCE) 25 MG TABS tablet Take 25 mg by mouth daily. 90 tablet 1  . folic acid (FOLVITE) A999333 MCG tablet Take 400 mcg by mouth daily. Reported on 07/25/2015    . glipiZIDE (GLUCOTROL) 10 MG tablet TAKE 1 TABLET BY MOUTH TWICE A DAY BEFORE MEALS 180 tablet 1  . glucose blood test strip Please dispense Contour Next test strips to check Blood glucose twice a day, (90 day supply) E11.9 200 each 3  . pantoprazole  (PROTONIX) 40 MG tablet Take 1 tablet (40 mg total) by mouth daily. 30 tablet 3  . rosuvastatin (CRESTOR) 10 MG tablet Take 1 tablet (10 mg total) by mouth daily. 90 tablet 3  . sitaGLIPtin (JANUVIA) 100 MG tablet Take 1 tablet (100 mg total) by mouth daily. 90 tablet 1  . tamsulosin (FLOMAX) 0.4 MG CAPS capsule Take 1 capsule (0.4 mg total) by mouth daily. 30 capsule 3  . vitamin C (ASCORBIC ACID) 500 MG tablet Take 500 mg by mouth daily. Reported on 07/25/2015    . vitamin E 400 UNIT capsule Take 400 Units by mouth daily.    . mometasone (ELOCON) 0.1 % lotion Apply topically 2 (two) times daily as needed. (Patient not taking: Reported on 04/26/2019) 60 mL 3   No facility-administered medications prior to visit.     Review of Systems;  Patient denies headache, fevers, malaise, unintentional weight loss, skin rash, eye pain, sinus congestion and sinus pain, sore throat, dysphagia,  hemoptysis , cough, dyspnea, wheezing, chest pain, palpitations, orthopnea, edema, abdominal pain, nausea, melena, diarrhea, constipation, flank pain, dysuria, hematuria, urinary  Frequency, nocturia, numbness, tingling, seizures,  Focal weakness, Loss of consciousness,  Tremor, insomnia, depression, anxiety, and suicidal ideation.      Objective:  BP 128/80 (BP Location: Left Arm,  Patient Position: Sitting, Cuff Size: Normal)   Pulse 76   Temp (!) 96.7 F (35.9 C) (Temporal)   Resp 15   Ht 5\' 8"  (1.727 m)   Wt 151 lb 9.6 oz (68.8 kg)   SpO2 96%   BMI 23.05 kg/m   BP Readings from Last 3 Encounters:  05/15/19 128/80  09/14/18 110/70  07/25/18 124/70    Wt Readings from Last 3 Encounters:  05/15/19 151 lb 9.6 oz (68.8 kg)  04/26/19 150 lb (68 kg)  09/14/18 154 lb (69.9 kg)    General appearance: alert, cooperative and appears stated age Ears: normal TM's and external ear canals both ears Throat: lips, mucosa, and tongue normal; teeth and gums normal Neck: no adenopathy, no carotid bruit, supple,  symmetrical, trachea midline and thyroid not enlarged, symmetric, no tenderness/mass/nodules Back: symmetric, no curvature. ROM normal. No CVA tenderness. Lungs: clear to auscultation bilaterally Heart: regular rate and rhythm, S1, S2 normal, no murmur, click, rub or gallop Abdomen: soft, non-tender; bowel sounds normal; no masses,  no organomegaly Pulses: 2+ and symmetric Skin: Skin color, texture, turgor normal. No rashes or lesions Lymph nodes: Cervical, supraclavicular, and axillary nodes normal.  Lab Results  Component Value Date   HGBA1C 8.3 (H) 05/11/2019   HGBA1C 7.8 (H) 01/22/2019   HGBA1C 8.8 (H) 09/14/2018    Lab Results  Component Value Date   CREATININE 1.14 05/11/2019   CREATININE 1.14 01/22/2019   CREATININE 1.12 09/14/2018    Lab Results  Component Value Date   WBC 5.5 05/11/2019   HGB 16.3 05/11/2019   HCT 49.3 05/11/2019   PLT 169.0 05/11/2019   GLUCOSE 131 (H) 05/11/2019   CHOL 128 09/14/2018   TRIG 59.0 09/14/2018   HDL 41.30 09/14/2018   LDLDIRECT 214.0 08/05/2016   LDLCALC 75 09/14/2018   ALT 25 05/11/2019   AST 22 05/11/2019   NA 140 05/11/2019   K 4.3 05/11/2019   CL 101 05/11/2019   CREATININE 1.14 05/11/2019   BUN 14 05/11/2019   CO2 31 05/11/2019   TSH 0.84 09/21/2017   PSA 0.95 05/11/2019   HGBA1C 8.3 (H) 05/11/2019   MICROALBUR <0.7 09/14/2018    Mr Brain Wo Contrast  Result Date: 02/04/2019 CLINICAL DATA:  Intermittent headaches for 2 months. Difficulty with word recall. EXAM: MRI HEAD WITHOUT CONTRAST TECHNIQUE: Multiplanar, multiecho pulse sequences of the brain and surrounding structures were obtained without intravenous contrast. COMPARISON:  MRI brain 08/13/2016 FINDINGS: Brain: No acute infarct, hemorrhage, or mass lesion is present. Periventricular and scattered subcortical T2 hyperintensities bilaterally are mildly advanced for age, not significantly changed from prior exam. Remote lacunar infarcts of the left thalamus and right  internal capsule are stable. Foci of susceptibility involving the anterior left frontal lobe and posterior left parietal lobe are stable. These correlate with calcifications on the head CT. No vascular malformations are evident. This may be related to prior infection The internal auditory canals are within normal limits. The brainstem and cerebellum are within normal limits. Vascular: Flow is present in the major intracranial arteries. Skull and upper cervical spine: The craniocervical junction is normal. Upper cervical spine is within normal limits. Marrow signal is unremarkable. Sinuses/Orbits: The paranasal sinuses and mastoid air cells are clear. Bilateral lens replacements are noted. Globes and orbits are otherwise unremarkable. IMPRESSION: 1. Stable appearance of periventricular and subcortical white matter changes bilaterally, mildly advanced for age. This likely reflects the sequela of chronic microvascular ischemia. 2. Stable remote calcifications of the anterior  left frontal lobe and posterior left parietal lobe. This likely is related to prior infection or trauma. Vascular malformation is considered less likely without other findings. 3. No acute intracranial abnormality or significant interval change explain the patient's recent headaches. Electronically Signed   By: San Morelle M.D.   On: 02/04/2019 17:58    Assessment & Plan:   Problem List Items Addressed This Visit      Unprioritized   Diabetes mellitus type 2, uncontrolled, with complications ([place])    A999333 elevated despite normal fasting glucose.  Due to dietary indiscretions Advised to check 2 hr post prandials,  s.       Right-sided headache    Recurrent , still occurring 4 times per week,  Does not want to see neurology .  MRI brain reviewed,  Trial of atenolol       Urinary hesitancy    improved with flomax        A total of 40 minutes was spent with patient more than half of which was spent in counseling patient  on the above mentioned issues , reviewing and explaining recent labs and imaging studies done, and coordination of care. I am having [name] start on atenolol. I am also having him maintain his vitamin C, folic acid, vitamin E, rosuvastatin, mometasone, sitaGLIPtin, empagliflozin, glipiZIDE, glucose blood, ZyrTEC Allergy, tamsulosin, and pantoprazole.  Meds ordered this encounter  Medications  . atenolol (TENORMIN) 25 MG tablet    Sig: Take 1 tablet (25 mg total) by mouth at bedtime.    Dispense:  90 tablet    Refill:  3    There are no discontinued medications.  Follow-up: Return in about 4 weeks (around 06/12/2019).   Crecencio Mc, MD

## 2019-05-15 NOTE — Patient Instructions (Addendum)
Check your blood sugar 2 hours after your evening meal , or after any meal with rice or plantains.  If our blood sugar is > 160 ,  That's not good.   Trial of atenolol at bedtime for your frequent headaches

## 2019-05-15 NOTE — Assessment & Plan Note (Signed)
Improved with use of protonix but not resolved.  Occurring 4 nights per week on average with a lar

## 2019-05-15 NOTE — Assessment & Plan Note (Signed)
Recurrent , still occurring 4 times per week,  Does not want to see neurology .  MRI brain reviewed,  Trial of atenolol

## 2019-06-13 ENCOUNTER — Encounter: Payer: Self-pay | Admitting: Internal Medicine

## 2019-06-13 ENCOUNTER — Ambulatory Visit (INDEPENDENT_AMBULATORY_CARE_PROVIDER_SITE_OTHER): Payer: BC Managed Care – PPO | Admitting: Internal Medicine

## 2019-06-13 DIAGNOSIS — E118 Type 2 diabetes mellitus with unspecified complications: Secondary | ICD-10-CM

## 2019-06-13 DIAGNOSIS — I6930 Unspecified sequelae of cerebral infarction: Secondary | ICD-10-CM | POA: Diagnosis not present

## 2019-06-13 DIAGNOSIS — E1165 Type 2 diabetes mellitus with hyperglycemia: Secondary | ICD-10-CM

## 2019-06-13 DIAGNOSIS — IMO0002 Reserved for concepts with insufficient information to code with codable children: Secondary | ICD-10-CM

## 2019-06-13 DIAGNOSIS — R519 Headache, unspecified: Secondary | ICD-10-CM | POA: Diagnosis not present

## 2019-06-13 DIAGNOSIS — M25511 Pain in right shoulder: Secondary | ICD-10-CM

## 2019-06-13 NOTE — Progress Notes (Signed)
Virtual Visit converted to Telephone Note  This visit type was conducted due to national recommendations for restrictions regarding the COVID-19 pandemic (e.g. social distancing).  This format is felt to be most appropriate for this patient at this time.  All issues noted in this document were discussed and addressed.  No physical exam was performed (except for noted visual exam findings with Video Visits).   I attempted to connect with@ on 06/13/19 at  8:30 AM EST by a video enabled telemedicine application or telephone and verified that I am speaking with the correct person using two identifiers.. Interactive audio and video telecommunications were initially established beteen this provider and patient, however ultimately failed, due to patient having technical difficulties. We continued and completed visit with audio only  and verified that I am speaking with the correct person using two identifiers  Location patient: home Location provider: work or home office Persons participating in the virtual visit: patient, provider  I discussed the limitations, risks, security and privacy concerns of performing an evaluation and management service by telephone and the availability of in person appointments. I also discussed with the patient that there may be a patient responsible charge related to this service. The patient expressed understanding and agreed to proceed.   Reason for visit: one month follow up on uncontrolled diabetes  HPI:  58 yr old male with T2DM. suboptimal control despite maximal doses of Januvia, Jardiance and glipizide  (inolerant of metfromoin)  Has been check post prandial dinner sugars and most are > 160 .  Not exercising  Headaches less frequent since starting atenolol.   New cc:  Right shoulder pain for two weeks started after sleeping on right side all night.  Cannot flex or abduct without severe pain,  No bony tenderness .  Discussed referral to [facility]   Lab  Results  Component Value Date   HGBA1C 8.3 (H) 05/11/2019      ROS: See pertinent positives and negatives per HPI.  Past Medical History:  Diagnosis Date  . Diabetes mellitus without complication ([place])   . Hyperlipidemia   . Hypertension     Past Surgical History:  Procedure Laterality Date  . CATARACT EXTRACTION W/PHACO Right 12/21/2016   Procedure: CATARACT EXTRACTION PHACO AND INTRAOCULAR LENS PLACEMENT (IOC);  Surgeon: Birder Robson, MD;  Location: [facility] ORS;  Service: Ophthalmology;  Laterality: Right;  [place] 00:27 AP% 11.3 CDE 3.08 fluid pack lot # [id number] H  . CATARACT EXTRACTION W/PHACO Left 01/11/2017   Procedure: CATARACT EXTRACTION PHACO AND INTRAOCULAR LENS PLACEMENT (IOC);  Surgeon: Birder Robson, MD;  Location: [facility] ORS;  Service: Ophthalmology;  Laterality: Left;  [place]  00:30 AP% 13.0 CDE 3.93 Fluid pack lot # [id number] H  . COLONOSCOPY    . NO PAST SURGERIES      Family History  Problem Relation Age of Onset  . Diabetes Brother     SOCIAL HX:  reports that he has never smoked. He has never used smokeless tobacco. He reports current alcohol use. He reports that he does not use drugs.   Current Outpatient Medications:  .  atenolol (TENORMIN) 25 MG tablet, Take 1 tablet (25 mg total) by mouth at bedtime., Disp: 90 tablet, Rfl: 3 .  Cetirizine HCl (ZYRTEC ALLERGY) 10 MG CAPS, Take 1 capsule (10 mg total) by mouth daily., Disp: 90 capsule, Rfl: 3 .  empagliflozin (JARDIANCE) 25 MG TABS tablet, Take 25 mg by mouth daily., Disp: 90 tablet, Rfl: 1 .  folic acid (FOLVITE) A999333 MCG  tablet, Take 400 mcg by mouth daily. Reported on 07/25/2015, Disp: , Rfl:  .  glipiZIDE (GLUCOTROL) 10 MG tablet, TAKE 1 TABLET BY MOUTH TWICE A DAY BEFORE MEALS, Disp: 180 tablet, Rfl: 1 .  glucose blood test strip, Please dispense Contour Next test strips to check Blood glucose twice a day, (90 day supply) E11.9, Disp: 200 each, Rfl: 3 .  mometasone (ELOCON) 0.1 % lotion, Apply topically 2  (two) times daily as needed., Disp: 60 mL, Rfl: 3 .  pantoprazole (PROTONIX) 40 MG tablet, Take 1 tablet (40 mg total) by mouth daily., Disp: 30 tablet, Rfl: 3 .  rosuvastatin (CRESTOR) 10 MG tablet, Take 1 tablet (10 mg total) by mouth daily., Disp: 90 tablet, Rfl: 3 .  sitaGLIPtin (JANUVIA) 100 MG tablet, Take 1 tablet (100 mg total) by mouth daily., Disp: 90 tablet, Rfl: 1 .  tamsulosin (FLOMAX) 0.4 MG CAPS capsule, Take 1 capsule (0.4 mg total) by mouth daily., Disp: 30 capsule, Rfl: 3 .  vitamin C (ASCORBIC ACID) 500 MG tablet, Take 500 mg by mouth daily. Reported on 07/25/2015, Disp: , Rfl:  .  vitamin E 400 UNIT capsule, Take 400 Units by mouth daily., Disp: , Rfl:   EXAM:   General impression: alert, cooperative and articulate.  No signs of being in distress  Lungs: speech is fluent sentence length suggests that patient is not short of breath and not punctuated by cough, sneezing or sniffing. Marland Kitchen   Psych: affect normal.  speech is articulate and non pressured .  Denies suicidal thoughts   ASSESSMENT AND PLAN:  Discussed the following assessment and plan:  Acute pain of right shoulder  Diabetes mellitus type 2, uncontrolled, with complications (HCC)  Sequela of lacunar infarction  Right-sided headache  Acute pain of right shoulder occurring without trauma,  after prolonged rest on shoulder,  Referral to [facility] for evaluation  Of probable subacromial bursitis.  Aleve/tylenol recommended bid.   Diabetes mellitus type 2, uncontrolled, with complications Post prandial CBS are elevated on maximal orla therapy mealtime isnuin needed.  Will hold off until pre and post prandial sugars can be reviewed   Sequela of lacunar infarction Noted on MRI brain done to evaluate memory loss. Goal bp 120/70.  Advised to monitor BP at home.   Right-sided headache Frequency improved with atenolol use. No changes today     I discussed the assessment and treatment plan with the patient. The  patient was provided an opportunity to ask questions and all were answered. The patient agreed with the plan and demonstrated an understanding of the instructions.   The patient was advised to call back or seek an in-person evaluation if the symptoms worsen or if the condition fails to improve as anticipated.   I provided  25 minutes of non-face-to-face time during this encounter reviewing patient's current problems and post surgeries.  Providing counseling on the above mentioned problems , and coordination  of care . Crecencio Mc, MD

## 2019-06-13 NOTE — Assessment & Plan Note (Signed)
Noted on MRI brain done to evaluate memory loss. Goal bp 120/70.  Advised to monitor BP at home.

## 2019-06-13 NOTE — Assessment & Plan Note (Signed)
occurring without trauma,  after prolonged rest on shoulder,  Referral to [facility] for evaluation  Of probable subacromial bursitis.  Aleve/tylenol recommended bid.

## 2019-06-13 NOTE — Assessment & Plan Note (Signed)
Frequency improved with atenolol use. No changes today

## 2019-06-13 NOTE — Assessment & Plan Note (Signed)
Post prandial CBS are elevated on maximal orla therapy mealtime isnuin needed.  Will hold off until pre and post prandial sugars can be reviewed

## 2019-07-18 ENCOUNTER — Other Ambulatory Visit: Payer: Self-pay | Admitting: Internal Medicine

## 2019-07-22 ENCOUNTER — Other Ambulatory Visit: Payer: Self-pay | Admitting: Internal Medicine

## 2019-07-22 DIAGNOSIS — E1165 Type 2 diabetes mellitus with hyperglycemia: Secondary | ICD-10-CM

## 2019-07-22 DIAGNOSIS — IMO0002 Reserved for concepts with insufficient information to code with codable children: Secondary | ICD-10-CM

## 2019-12-22 DIAGNOSIS — Z20822 Contact with and (suspected) exposure to covid-19: Secondary | ICD-10-CM | POA: Diagnosis not present

## 2019-12-22 DIAGNOSIS — Z03818 Encounter for observation for suspected exposure to other biological agents ruled out: Secondary | ICD-10-CM | POA: Diagnosis not present

## 2019-12-28 ENCOUNTER — Encounter: Payer: Self-pay | Admitting: Internal Medicine

## 2019-12-28 ENCOUNTER — Telehealth (INDEPENDENT_AMBULATORY_CARE_PROVIDER_SITE_OTHER): Payer: BC Managed Care – PPO | Admitting: Internal Medicine

## 2019-12-28 ENCOUNTER — Ambulatory Visit
Admission: RE | Admit: 2019-12-28 | Discharge: 2019-12-28 | Disposition: A | Discharge status: Home / Self Care | Payer: BC Managed Care – PPO | Source: Ambulatory Visit | Attending: Internal Medicine | Admitting: Internal Medicine

## 2019-12-28 ENCOUNTER — Other Ambulatory Visit: Payer: Self-pay

## 2019-12-28 VITALS — Ht 68.0 in | Wt 151.0 lb

## 2019-12-28 DIAGNOSIS — R058 Other specified cough: Secondary | ICD-10-CM

## 2019-12-28 DIAGNOSIS — J45901 Unspecified asthma with (acute) exacerbation: Secondary | ICD-10-CM

## 2019-12-28 DIAGNOSIS — R05 Cough: Secondary | ICD-10-CM | POA: Diagnosis not present

## 2019-12-28 DIAGNOSIS — R0600 Dyspnea, unspecified: Secondary | ICD-10-CM

## 2019-12-28 DIAGNOSIS — R0602 Shortness of breath: Secondary | ICD-10-CM | POA: Diagnosis not present

## 2019-12-28 DIAGNOSIS — F17200 Nicotine dependence, unspecified, uncomplicated: Secondary | ICD-10-CM | POA: Diagnosis not present

## 2019-12-28 DIAGNOSIS — R0609 Other forms of dyspnea: Secondary | ICD-10-CM | POA: Insufficient documentation

## 2019-12-28 MED ORDER — ALBUTEROL SULFATE HFA 108 (90 BASE) MCG/ACT IN AERS: 1.0000 | INHALATION_SPRAY | Freq: Four times a day (QID) | RESPIRATORY_TRACT | 0 refills | Status: DC | PRN

## 2019-12-28 MED ORDER — AZITHROMYCIN 250 MG PO TABS: ORAL_TABLET | ORAL | 0 refills | Status: DC

## 2019-12-28 MED ORDER — HYDROCOD POLST-CPM POLST ER 10-8 MG/5ML PO SUER: 5.0000 mL | Freq: Every evening | ORAL | 0 refills | Status: DC | PRN

## 2019-12-28 MED ORDER — MUCINEX DM MAXIMUM STRENGTH 60-1200 MG PO TB12: 1.0000 | ORAL_TABLET | Freq: Two times a day (BID) | ORAL | 0 refills | Status: DC | PRN

## 2019-12-28 NOTE — Progress Notes (Signed)
Seeing Patient for a cough onset of last Friday. Cough is productive and there is yellow Phlegm. States it feel as though he is loosing his breath. Cough with SOB. Patient was tested last Saturday for Covid and this was negative.   Patient states he has seasonal allergies and he does se an Horticulturist, commercial.

## 2019-12-28 NOTE — Progress Notes (Signed)
telephone Note  I connected with George Gordon  on 12/28/19 at  4:00 PM EDT by a telephone and verified that I am speaking with the correct person using two identifiers.  Location patient: car  Location provider:work or home office Persons participating in the virtual visit: patient, provider  I discussed the limitations of evaluation and management by telemedicine and the availability of in person appointments. The patient expressed understanding and agreed to proceed.   HPI: Sick visit  Fever noted 89 F Friday to Saturday and c/o cough with productive yellow phelgm and sob with coughing and talking at times, nasal congestion and ears popping only on zyrtec (does not want to use nasal sprays). Denies sinus pressure. He was tested for covid 12/22/19 and negative son and wife have also been coughing. He does have allergies and has seen an allergist locally per his PCP.    Denies h/o asthma/COPD but does have environmental allergies  He had J&J vaccine 09/25/19 x 1   He is not a smoker   ROS: See pertinent positives and negatives per HPI.  Past Medical History:  Diagnosis Date  . Diabetes mellitus without complication ([place])   . Hyperlipidemia   . Hypertension     Past Surgical History:  Procedure Laterality Date  . CATARACT EXTRACTION W/PHACO Right 12/21/2016   Procedure: CATARACT EXTRACTION PHACO AND INTRAOCULAR LENS PLACEMENT (IOC);  Surgeon: Birder Robson, MD;  Location: [facility] ORS;  Service: Ophthalmology;  Laterality: Right;  [place] 00:27 AP% 11.3 CDE 3.08 fluid pack lot # [id number]  . CATARACT EXTRACTION W/PHACO Left 01/11/2017   Procedure: CATARACT EXTRACTION PHACO AND INTRAOCULAR LENS PLACEMENT (IOC);  Surgeon: Birder Robson, MD;  Location: [facility] OR[facility];  Service: Ophthalmology;  Laterality: Left;  [place]  00:30 AP% 13.0 CDE 3.93 Fluid pack lot # [id number]  . COLONOSCOPY    . NO PAST SURGERIES      Family History  Problem Relation Age of Onset  . Diabetes Brother      SOCIAL HX: has 2 y.o son and wife    Current Outpatient Medications:  .  atenolol (TENORMIN) 25 MG tablet, Take 1 tablet (25 mg total) by mouth at bedtime., Disp: 90 tablet, Rfl: 3 .  Cetirizine HCl (ZYRTEC ALLERGY) 10 MG CAPS, Take 1 capsule (10 mg total) by mouth daily., Disp: 90 capsule, Rfl: 3 .  empagliflozin (JARDIANCE) 25 MG TABS tablet, Take 25 mg by mouth daily., Disp: 90 tablet, Rfl: 1 .  glipiZIDE (GLUCOTROL) 10 MG tablet, TAKE 1 TABLET BY MOUTH TWICE A DAY BEFORE MEALS, Disp: 180 tablet, Rfl: 1 .  pantoprazole (PROTONIX) 40 MG tablet, TAKE 1 TABLET BY MOUTH EVERY DAY, Disp: 90 tablet, Rfl: 1 .  rosuvastatin (CRESTOR) 10 MG tablet, TAKE 1 TABLET BY MOUTH DAILY, Disp: 90 tablet, Rfl: 3 .  sitaGLIPtin (JANUVIA) 100 MG tablet, Take 1 tablet (100 mg total) by mouth daily., Disp: 90 tablet, Rfl: 1 .  vitamin C (ASCORBIC ACID) 500 MG tablet, Take 500 mg by mouth daily. Reported on 07/25/2015, Disp: , Rfl:  .  vitamin E 400 UNIT capsule, Take 400 Units by mouth daily., Disp: , Rfl:  .  albuterol (VENTOLIN HFA) 108 (90 Base) MCG/ACT inhaler, Inhale 1-2 puffs into the lungs every 6 (six) hours as needed for wheezing or shortness of breath (cough)., Disp: 18 g, Rfl: 0 .  azithromycin (ZITHROMAX) 250 MG tablet, 2 pills day 1 and 1 pill day 2-5 with food, Disp: 6 tablet, Rfl: 0 .  chlorpheniramine-HYDROcodone (  TUSSIONEX PENNKINETIC ER) 10-8 MG/5ML SUER, Take 5 mLs by mouth at bedtime as needed for cough. Do not fill now only if needed pt will come back to pick up, Disp: 115 mL, Rfl: 0 .  Dextromethorphan-guaiFENesin (MUCINEX DM MAXIMUM STRENGTH) 60-1200 MG TB12, Take 1 tablet by mouth 2 (two) times daily as needed., Disp: 30 tablet, Rfl: 0 .  folic acid (FOLVITE) 967 MCG tablet, Take 400 mcg by mouth daily. Reported on 07/25/2015 (Patient not taking: Reported on 12/28/2019), Disp: , Rfl:  .  glucose blood test strip, Please dispense Contour Next test strips to check Blood glucose twice a day, (90  day supply) E11.9, Disp: 200 each, Rfl: 3 .  mometasone (ELOCON) 0.1 % lotion, Apply topically 2 (two) times daily as needed. (Patient not taking: Reported on 12/28/2019), Disp: 60 mL, Rfl: 3 .  tamsulosin (FLOMAX) 0.4 MG CAPS capsule, TAKE 1 CAPSULE BY MOUTH EVERY DAY (Patient not taking: Reported on 12/28/2019), Disp: 90 capsule, Rfl: 1  EXAM:  VITALS per patient if applicable:  LUNGS: talking to pt no signs of respiratory distress, breathing rate appears normal, no obvious gross SOB, gasping or wheezing or cough  PSYCH/NEURO: pleasant and cooperative, no obvious depression or anxiety, speech and thought processing grossly intact  ASSESSMENT AND PLAN:  Discussed the following assessment and plan:  Productive cough and sob etiology could be URI/allergic asthma/bronchitis- Plan: DG Chest 2 View negative, Dextromethorphan-guaiFENesin (MUCINEX DM MAXIMUM STRENGTH) 60-1200 MG TB12, albuterol (VENTOLIN HFA) 108 (90 Base) MCG/ACT inhaler, azithromycin (ZITHROMAX) 250 MG tablet  To be filled if not better chlorpheniramine-HYDROcodone (TUSSIONEX PENNKINETIC ER) 10-8 MG/5ML SUER  covid 19 negative 12/22/19  -we discussed possible serious and likely etiologies, options for evaluation and workup, limitations of telemedicine visit vs in person visit, treatment, treatment risks and precautions. Pt prefers to treat via telemedicine empirically rather then risking or undertaking an in person visit at this moment. Patient agrees to seek prompt in person care if worsening, new symptoms arise, or if is not improving with treatment.   I discussed the assessment and treatment plan with the patient. The patient was provided an opportunity to ask questions and all were answered. The patient agreed with the plan and demonstrated an understanding of the instructions.   The patient was advised to call back or seek an in-person evaluation if the symptoms worsen or if the condition fails to improve as  anticipated.  Time spent 20 minutes Delorise Jackson, MD

## 2020-01-27 ENCOUNTER — Telehealth: Payer: Self-pay | Admitting: Internal Medicine

## 2020-01-27 DIAGNOSIS — J45901 Unspecified asthma with (acute) exacerbation: Secondary | ICD-10-CM

## 2020-01-27 DIAGNOSIS — R0609 Other forms of dyspnea: Secondary | ICD-10-CM

## 2020-01-27 DIAGNOSIS — R058 Other specified cough: Secondary | ICD-10-CM

## 2020-01-27 NOTE — Telephone Encounter (Signed)
Pt requesting refill of proair inhaler

## 2020-01-28 MED ORDER — ALBUTEROL SULFATE HFA 108 (90 BASE) MCG/ACT IN AERS
1.0000 | INHALATION_SPRAY | Freq: Four times a day (QID) | RESPIRATORY_TRACT | 2 refills | Status: DC | PRN
Start: 2020-01-28 — End: 2021-05-13

## 2020-01-28 NOTE — Telephone Encounter (Signed)
Medication has been refilled.

## 2020-01-28 NOTE — Addendum Note (Signed)
Addended by: Adair Laundry on: 01/28/2020 08:08 AM   Modules accepted: Orders

## 2020-02-28 ENCOUNTER — Other Ambulatory Visit: Payer: Self-pay

## 2020-02-28 MED ORDER — SITAGLIPTIN PHOSPHATE 100 MG PO TABS: 100.0000 mg | ORAL_TABLET | Freq: Every day | ORAL | 0 refills | Status: DC

## 2020-04-04 DIAGNOSIS — Z20822 Contact with and (suspected) exposure to covid-19: Secondary | ICD-10-CM | POA: Diagnosis not present

## 2020-04-28 LAB — HM DIABETES EYE EXAM

## 2020-05-08 DIAGNOSIS — H26491 Other secondary cataract, right eye: Secondary | ICD-10-CM | POA: Diagnosis not present

## 2020-06-28 DIAGNOSIS — H26491 Other secondary cataract, right eye: Secondary | ICD-10-CM | POA: Diagnosis not present

## 2020-06-28 DIAGNOSIS — H43812 Vitreous degeneration, left eye: Secondary | ICD-10-CM | POA: Diagnosis not present

## 2020-06-28 DIAGNOSIS — Z961 Presence of intraocular lens: Secondary | ICD-10-CM | POA: Diagnosis not present

## 2020-09-09 LAB — HM DIABETES EYE EXAM

## 2020-09-26 ENCOUNTER — Other Ambulatory Visit: Payer: Self-pay | Admitting: Internal Medicine

## 2020-09-26 DIAGNOSIS — E1165 Type 2 diabetes mellitus with hyperglycemia: Secondary | ICD-10-CM

## 2020-09-26 DIAGNOSIS — IMO0002 Reserved for concepts with insufficient information to code with codable children: Secondary | ICD-10-CM

## 2020-09-29 ENCOUNTER — Telehealth: Payer: Self-pay

## 2020-09-29 DIAGNOSIS — IMO0002 Reserved for concepts with insufficient information to code with codable children: Secondary | ICD-10-CM

## 2020-09-29 DIAGNOSIS — E1165 Type 2 diabetes mellitus with hyperglycemia: Secondary | ICD-10-CM

## 2020-09-29 MED ORDER — EMPAGLIFLOZIN 25 MG PO TABS: 25.0000 mg | ORAL_TABLET | Freq: Every day | ORAL | 1 refills | Status: DC

## 2020-09-29 NOTE — Telephone Encounter (Signed)
PA for George Gordon has been submitted and approved.

## 2020-10-01 ENCOUNTER — Other Ambulatory Visit: Payer: Self-pay

## 2020-10-01 MED ORDER — SITAGLIPTIN PHOSPHATE 100 MG PO TABS: 100.0000 mg | ORAL_TABLET | Freq: Every day | ORAL | 0 refills | Status: DC

## 2020-12-30 ENCOUNTER — Telehealth: Payer: BC Managed Care – PPO | Admitting: Adult Health

## 2021-02-04 ENCOUNTER — Telehealth: Payer: Self-pay | Admitting: Internal Medicine

## 2021-02-04 NOTE — Telephone Encounter (Signed)
That is fine with me.

## 2021-02-04 NOTE — Telephone Encounter (Signed)
Patient needs a prostate exam and he loves and wants to still see Dr Derrel Nip, but rather have a male do this exam. Dr Derrel Nip and Dr Caryl Bis could we set him up to see Dr Caryl Bis for this exam only?

## 2021-02-04 NOTE — Telephone Encounter (Signed)
Disregard prior message it was meant for Dr. Caryl Bis.  Thanks.  Bostyn Bogie,cma

## 2021-03-13 ENCOUNTER — Telehealth: Payer: Self-pay | Admitting: Internal Medicine

## 2021-03-13 DIAGNOSIS — IMO0002 Reserved for concepts with insufficient information to code with codable children: Secondary | ICD-10-CM

## 2021-03-13 DIAGNOSIS — E1165 Type 2 diabetes mellitus with hyperglycemia: Secondary | ICD-10-CM

## 2021-03-13 NOTE — Telephone Encounter (Signed)
Okay to refill his medications until his appointment? He is scheduled with you this month but has not seen you since 2020

## 2021-03-13 NOTE — Telephone Encounter (Signed)
Patient calling in for refills of his medications. Has not been seen since 2020, scheduled to be seen 03/2021.  Pended for your approval or denial.   Pharmacy states they do not have any refills on file for any of the medications.

## 2021-03-13 NOTE — Telephone Encounter (Signed)
LMTCB

## 2021-03-17 NOTE — Telephone Encounter (Signed)
Patient informed and verbalized understanding

## 2021-04-09 ENCOUNTER — Ambulatory Visit: Payer: BC Managed Care – PPO

## 2021-04-09 ENCOUNTER — Encounter: Payer: Self-pay | Admitting: Internal Medicine

## 2021-04-09 ENCOUNTER — Ambulatory Visit: Payer: BC Managed Care – PPO | Admitting: Internal Medicine

## 2021-04-09 ENCOUNTER — Other Ambulatory Visit: Payer: Self-pay

## 2021-04-09 VITALS — BP 138/86 | HR 67 | Temp 97.7°F | Ht 68.0 in | Wt 143.8 lb

## 2021-04-09 DIAGNOSIS — R972 Elevated prostate specific antigen [PSA]: Secondary | ICD-10-CM

## 2021-04-09 DIAGNOSIS — Z23 Encounter for immunization: Secondary | ICD-10-CM

## 2021-04-09 DIAGNOSIS — I10 Essential (primary) hypertension: Secondary | ICD-10-CM | POA: Diagnosis not present

## 2021-04-09 DIAGNOSIS — E1165 Type 2 diabetes mellitus with hyperglycemia: Secondary | ICD-10-CM

## 2021-04-09 DIAGNOSIS — IMO0002 Reserved for concepts with insufficient information to code with codable children: Secondary | ICD-10-CM

## 2021-04-09 DIAGNOSIS — E1169 Type 2 diabetes mellitus with other specified complication: Secondary | ICD-10-CM

## 2021-04-09 DIAGNOSIS — E118 Type 2 diabetes mellitus with unspecified complications: Secondary | ICD-10-CM

## 2021-04-09 DIAGNOSIS — Z125 Encounter for screening for malignant neoplasm of prostate: Secondary | ICD-10-CM | POA: Diagnosis not present

## 2021-04-09 DIAGNOSIS — E785 Hyperlipidemia, unspecified: Secondary | ICD-10-CM

## 2021-04-09 LAB — COMPREHENSIVE METABOLIC PANEL
ALT: 25 U/L (ref 0–53)
AST: 16 U/L (ref 0–37)
Albumin: 3.8 g/dL (ref 3.5–5.2)
Alkaline Phosphatase: 73 U/L (ref 39–117)
BUN: 12 mg/dL (ref 6–23)
CO2: 32 mEq/L (ref 19–32)
Calcium: 9.2 mg/dL (ref 8.4–10.5)
Chloride: 97 mEq/L (ref 96–112)
Creatinine, Ser: 1.02 mg/dL (ref 0.40–1.50)
GFR: 79.02 mL/min (ref >60.00)
Glucose, Bld: 272 mg/dL — ABNORMAL HIGH (ref 70–99)
Potassium: 3.8 mEq/L (ref 3.5–5.1)
Sodium: 135 mEq/L (ref 135–145)
Total Bilirubin: 1.3 mg/dL — ABNORMAL HIGH (ref 0.2–1.2)
Total Protein: 6.9 g/dL (ref 6.0–8.3)

## 2021-04-09 LAB — LIPID PANEL
Cholesterol: 234 mg/dL — ABNORMAL HIGH (ref 0–200)
HDL: 46.2 mg/dL (ref >39.00)
LDL Cholesterol: 167 mg/dL — ABNORMAL HIGH (ref 0–99)
NonHDL: 187.64
Total CHOL/HDL Ratio: 5
Triglycerides: 102 mg/dL (ref 0.0–149.0)
VLDL: 20.4 mg/dL (ref 0.0–40.0)

## 2021-04-09 LAB — POCT GLYCOSYLATED HEMOGLOBIN (HGB A1C): Hemoglobin A1C: 13.6 % — AB (ref 4.0–5.6)

## 2021-04-09 LAB — PSA: PSA: 1.95 ng/mL (ref 0.10–4.00)

## 2021-04-09 LAB — MICROALBUMIN / CREATININE URINE RATIO
Creatinine,U: 153.2 mg/dL
Microalb Creat Ratio: 0.5 mg/g (ref 0.0–30.0)
Microalb, Ur: 0.8 mg/dL (ref 0.0–1.9)

## 2021-04-09 MED ORDER — TELMISARTAN 20 MG PO TABS: 20.0000 mg | ORAL_TABLET | Freq: Every day | ORAL | 1 refills | Status: DC

## 2021-04-09 MED ORDER — TRESIBA FLEXTOUCH 100 UNIT/ML ~~LOC~~ SOPN: 15.0000 [IU] | PEN_INJECTOR | Freq: Every day | SUBCUTANEOUS | 3 refills | Status: DC

## 2021-04-09 NOTE — Assessment & Plan Note (Addendum)
Starting [place] at 15 units daily for a1c > 12 and fasting CBGs of 300 or less , would not resume metformin or start a GLP 1 agonist  given weight loss and low BMI.  Will  Consider adding SGLT 2 inhibitor

## 2021-04-09 NOTE — Assessment & Plan Note (Signed)
Resume rosuvatatin 10 mg for risk reduction   Lab Results  Component Value Date   CHOL 234 (H) 04/09/2021   HDL 46.20 04/09/2021   LDLCALC 167 (H) 04/09/2021   LDLDIRECT 214.0 08/05/2016   TRIG 102.0 04/09/2021   CHOLHDL 5 04/09/2021

## 2021-04-09 NOTE — Assessment & Plan Note (Signed)
May be secondary to BPH with use of tamsulosin recently prescribed by urology for retention.  advised to follow up with urology.  Lab Results  Component Value Date   PSA 1.95 04/09/2021   PSA 0.95 05/11/2019

## 2021-04-09 NOTE — Progress Notes (Signed)
Patient presented for [place] teaching. Per Providers order from 04/09/21. Patient voiced no concerns nor showed signs of distress during injection teaching. Patient was instructed on how to give himself the [place] injections. Instructed patient that if further questions, they can call clinic to be given further direction. Patient seemed very comfortable with teaching & demonstrated back to me how to give injections.

## 2021-04-09 NOTE — Assessment & Plan Note (Signed)
starting telmisartan 20 mg daily . repeat bmet needed in one week

## 2021-04-09 NOTE — Progress Notes (Signed)
Subjective:  Patient ID: George Gordon, male    DOB: 03/28/1961  Age: 60 y.o. MRN: [id number]  CC: The primary encounter diagnosis was Diabetes mellitus type 2, uncontrolled, with complications ([place]). Diagnoses of Prostate cancer screening, Need for immunization against influenza, Increased prostate specific antigen (PSA) velocity, Essential hypertension, and Hyperlipidemia due to type 2 diabetes mellitus ([place]) were also pertinent to this visit.  HPI George Gordon presents for  Chief Complaint  Patient presents with   Follow-up    Follow up on diabetes, hypertension   This visit occurred during the SARS-CoV-2 public health emergency.  Safety protocols were in place, including screening questions prior to the visit, additional usage of staff PPE, and extensive cleaning of exam room while observing  appropriate contact time as indicated for disinfecting solutions.   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  He was last seen for diabetes management November 2020.  Has been off of all medications for 6 weeks.   He states that he lost his job at [employer] in October 02 2018 and has had a difficult time finding comparable work since then. He is currently unemployed but planning to pursue an alternative degree at a local community college in October to pursue a  master's degree  in Human Resourcess and Management next month. Course will be local community college  .he is currently insured by his wife's company as she is a full time professor.     Type 2 DM:  recently  his fasting sugars have been 280 to 300 .  He has urinary frequency and weight loss.  He Had an episode of  urinary retention ,  went to urologist and was presribed  tamsulosin once daily for the past   month  and symptoms have improved .  Voiding large volumes,  up to 1-2 hours .  Has been losing weight   8 lbs  without  exercising  thinks it is due to his diet.   Had significant financial difficulties for a while, wife supporting him since he lost his job ($75K salary lost).  Grateful for his wife's work and for the opportunity to spend time with his  young  son 3.6 yrs old    Reviewed lifestyle:  since he is currently unemployed,  he s managing his household.   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 90 capsule 3   glucose blood test strip Please dispense Contour Next test strips to check Blood glucose twice a day, (90 day supply) E11.9 200 each 3   tamsulosin (FLOMAX) 0.4 MG CAPS capsule TAKE 1 CAPSULE BY MOUTH EVERY DAY 90 capsule 1   vitamin C (ASCORBIC ACID) 500 MG tablet Take 500 mg by mouth daily. Reported on 07/25/2015     vitamin E 400 UNIT capsule Take 400 Units by mouth daily.     albuterol (VENTOLIN HFA) 108 (90 Base) MCG/ACT inhaler Inhale 1-2 puffs into the lungs every 6 (six) hours as needed for wheezing or shortness of breath (cough). (Patient not taking: Reported on 04/09/2021) 18 g 2   atenolol (TENORMIN) 25 MG tablet Take 1 tablet (25 mg total) by mouth at bedtime. (Patient not taking: Reported on 04/09/2021) 90  tablet 3   empagliflozin (JARDIANCE) 25 MG TABS tablet Take 1 tablet (25 mg total) by mouth daily. (Patient not taking: Reported on 04/09/2021) 90 tablet 1   folic acid (FOLVITE) 614 MCG tablet Take 400 mcg by mouth daily. Reported on 07/25/2015 (Patient not taking: No sig reported)     glipiZIDE (GLUCOTROL) 10 MG tablet TAKE 1 TABLET BY MOUTH TWICE A DAY BEFORE MEALS (Patient not taking: Reported on 04/09/2021) 180 tablet 2   pantoprazole (PROTONIX) 40 MG tablet TAKE 1 TABLET BY MOUTH EVERY DAY (Patient not taking: Reported on 04/09/2021) 90 tablet 1   rosuvastatin (CRESTOR) 10 MG tablet TAKE 1 TABLET BY MOUTH DAILY (Patient not taking:  Reported on 04/09/2021) 90 tablet 3   sitaGLIPtin (JANUVIA) 100 MG tablet Take 1 tablet (100 mg total) by mouth daily. (Patient not taking: Reported on 04/09/2021) 90 tablet 0   azithromycin (ZITHROMAX) 250 MG tablet 2 pills day 1 and 1 pill day 2-5 with food (Patient not taking: Reported on 04/09/2021) 6 tablet 0   chlorpheniramine-HYDROcodone (TUSSIONEX PENNKINETIC ER) 10-8 MG/5ML SUER Take 5 mLs by mouth at bedtime as needed for cough. Do not fill now only if needed pt will come back to pick up (Patient not taking: Reported on 04/09/2021) 115 mL 0   Dextromethorphan-guaiFENesin (MUCINEX DM MAXIMUM STRENGTH) 60-1200 MG TB12 Take 1 tablet by mouth 2 (two) times daily as needed. (Patient not taking: Reported on 04/09/2021) 30 tablet 0   mometasone (ELOCON) 0.1 % lotion Apply topically 2 (two) times daily as needed. (Patient not taking: No sig reported) 60 mL 3   No facility-administered medications prior to visit.    Review of Systems;  Patient denies headache, fevers, malaise, unintentional weight loss, skin rash, eye pain, sinus congestion and sinus pain, sore throat, dysphagia,  hemoptysis , cough, dyspnea, wheezing, chest pain, palpitations, orthopnea, edema, abdominal pain, nausea, melena, diarrhea, constipation, flank pain, dysuria, hematuria, urinary  Frequency, nocturia, numbness, tingling, seizures,  Focal weakness, Loss of consciousness,  Tremor, insomnia, depression, anxiety, and suicidal ideation.      Objective:  BP 138/86 (BP Location: Left Arm, Patient Position: Sitting, Cuff Size: Normal)   Pulse 67   Temp 97.7 F (36.5 C) (Oral)   Ht 5\' 8"  (1.727 m)   Wt 143 lb 12.8 oz (65.2 kg)   SpO2 98%   BMI 21.86 kg/m   BP Readings from Last 3 Encounters:  04/09/21 138/86  05/15/19 128/80  09/14/18 110/70    Wt Readings from Last 3 Encounters:  04/09/21 143 lb 12.8 oz (65.2 kg)  12/28/19 151 lb (68.5 kg)  06/13/19 151 lb (68.5 kg)    General appearance: alert, cooperative and  appears stated age Ears: normal TM's and external ear canals both ears Throat: lips, mucosa, and tongue normal; teeth and gums normal Neck: no adenopathy, no carotid bruit, supple, symmetrical, trachea midline and thyroid not enlarged, symmetric, no tenderness/mass/nodules Back: symmetric, no curvature. ROM normal. No CVA tenderness. Lungs: clear to auscultation bilaterally Heart: regular rate and rhythm, S1, S2 normal, no murmur, click, rub or gallop Abdomen: soft, non-tender; bowel sounds normal; no masses,  no organomegaly Pulses: 2+ and symmetric Skin: Skin color, texture, turgor normal. No rashes or lesions Lymph nodes: Cervical, supraclavicular, and axillary nodes normal.  Lab Results  Component Value Date   HGBA1C 13.6 (A) 04/09/2021   HGBA1C 8.3 (H) 05/11/2019   HGBA1C 7.8 (H) 01/22/2019    Lab Results  Component Value Date   CREATININE 1.02  04/09/2021   CREATININE 1.14 05/11/2019   CREATININE 1.14 01/22/2019    Lab Results  Component Value Date   WBC 5.5 05/11/2019   HGB 16.3 05/11/2019   HCT 49.3 05/11/2019   PLT 169.0 05/11/2019   GLUCOSE 272 (H) 04/09/2021   CHOL 234 (H) 04/09/2021   TRIG 102.0 04/09/2021   HDL 46.20 04/09/2021   LDLDIRECT 214.0 08/05/2016   LDLCALC 167 (H) 04/09/2021   ALT 25 04/09/2021   AST 16 04/09/2021   NA 135 04/09/2021   K 3.8 04/09/2021   CL 97 04/09/2021   CREATININE 1.02 04/09/2021   BUN 12 04/09/2021   CO2 32 04/09/2021   TSH 0.84 09/21/2017   PSA 1.95 04/09/2021   HGBA1C 13.6 (A) 04/09/2021   MICROALBUR 0.8 04/09/2021    DG Chest 2 View  Result Date: 12/28/2019 CLINICAL DATA:  sob with cough 12/21/19 with fever, Productive cough, Dyspnea on exertion. Hx diabetic. Nonsmoker EXAM: CHEST - 2 VIEW COMPARISON:  Chest radiograph 06/30/2010 FINDINGS: The heart size and mediastinal contours are within normal limits. The lungs are clear. No pneumothorax or pleural effusion. The visualized skeletal structures are unremarkable.  IMPRESSION: No evidence of active disease. Electronically Signed   By: Audie Pinto M.D.   On: 12/28/2019 17:12    Assessment & Plan:   Problem List Items Addressed This Visit       Unprioritized   Diabetes mellitus type 2, uncontrolled, with complications ([place]) - Primary    Starting Tresiba at 15 units daily for a1c > 12 and fasting CBGs of 300 or less , would not resume metformin or start a GLP 1 agonist  given weight loss and low BMI.  Will  Consider adding SGLT 2 inhibitor       Relevant Medications   insulin degludec (TRESIBA FLEXTOUCH) 100 UNIT/ML FlexTouch Pen   telmisartan (MICARDIS) 20 MG tablet   Other Relevant Orders   POCT HgB A1C (Completed)   Comprehensive metabolic panel (Completed)   Lipid panel (Completed)   Microalbumin / creatinine urine ratio (Completed)   [facility]   Essential hypertension    starting telmisartan 20 mg daily . repeat bmet needed in one week      Relevant Medications   telmisartan (MICARDIS) 20 MG tablet   Hyperlipidemia due to type 2 diabetes mellitus ([facility])    Resume rosuvatatin 10 mg for risk reduction   Lab Results  Component Value Date   CHOL 234 (H) 04/09/2021   HDL 46.20 04/09/2021   LDLCALC 167 (H) 04/09/2021   LDLDIRECT 214.0 08/05/2016   TRIG 102.0 04/09/2021   CHOLHDL 5 04/09/2021         Relevant Medications   insulin degludec (TRESIBA FLEXTOUCH) 100 UNIT/ML FlexTouch Pen   telmisartan (MICARDIS) 20 MG tablet   Increased prostate specific antigen (PSA) velocity    May be secondary to BPH with use of tamsulosin recently prescribed by urology for retention.  advised to follow up with urology.  Lab Results  Component Value Date   PSA 1.95 04/09/2021   PSA 0.95 05/11/2019         Other Visit Diagnoses     Prostate cancer screening       Relevant Orders   PSA (Completed)   Need for immunization against influenza       Relevant Orders   Flu Vaccine QUAD 72mo+IM (Fluarix,  Fluzone & Alfiuria Quad PF) (Completed)       I have discontinued Cecile Hearing  Fitz[place]ibbon's mometasone, Mucinex DM Maximum Strength, azithromycin, and chlorpheniramine-HYDROcodone. I am also having him start on Tresiba FlexTouch and telmisartan. Additionally, I am having him maintain his vitamin C, folic acid, vitamin E, glucose blood, ZyrTEC Allergy, atenolol, tamsulosin, rosuvastatin, albuterol, glipiZIDE, pantoprazole, empagliflozin, and sitaGLIPtin.  Meds ordered this encounter  Medications   insulin degludec (TRESIBA FLEXTOUCH) 100 UNIT/ML FlexTouch Pen    Sig: Inject 15 Units into the skin daily.    Dispense:  3 mL    Refill:  3   telmisartan (MICARDIS) 20 MG tablet    Sig: Take 1 tablet (20 mg total) by mouth daily.    Dispense:  90 tablet    Refill:  1    Medications Discontinued During This Encounter  Medication Reason   azithromycin (ZITHROMAX) 250 MG tablet    chlorpheniramine-HYDROcodone (TUSSIONEX PENNKINETIC ER) 10-8 MG/5ML SUER    Dextromethorphan-guaiFENesin (MUCINEX DM MAXIMUM STRENGTH) 60-1200 MG TB12    mometasone (ELOCON) 0.1 % lotion     Follow-up: Return in about 1 day (around 04/10/2021).   Crecencio Mc, MD

## 2021-04-09 NOTE — Patient Instructions (Addendum)
I am starting you on a once daily "basal" insulin called [place].  Your starting dose is 15 units   I am letting you know that I am referring to our clinical pharmacist ,  Catie Darnelle Maffucci.  Catie helps me provide additional services to my patients who are  dealing with  chronic diseases , like diabetes.  I do not expect this referral to cost you anything out of pocket, but I do think Catie will be able to help you get your diabetes under control and maximize your drug benefits.  She will make contact with  You by phone in the next week

## 2021-04-15 ENCOUNTER — Ambulatory Visit: Payer: BC Managed Care – PPO | Admitting: Pharmacist

## 2021-04-15 DIAGNOSIS — E785 Hyperlipidemia, unspecified: Secondary | ICD-10-CM

## 2021-04-15 DIAGNOSIS — I10 Essential (primary) hypertension: Secondary | ICD-10-CM

## 2021-04-15 DIAGNOSIS — E1165 Type 2 diabetes mellitus with hyperglycemia: Secondary | ICD-10-CM

## 2021-04-15 DIAGNOSIS — E1169 Type 2 diabetes mellitus with other specified complication: Secondary | ICD-10-CM

## 2021-04-15 MED ORDER — TRESIBA FLEXTOUCH 100 UNIT/ML ~~LOC~~ SOPN
20.0000 [IU] | PEN_INJECTOR | Freq: Every day | SUBCUTANEOUS | 3 refills | Status: DC
Start: 2021-04-15 — End: 2021-07-08

## 2021-04-15 MED ORDER — EMPAGLIFLOZIN 10 MG PO TABS
10.0000 mg | ORAL_TABLET | Freq: Every day | ORAL | 1 refills | Status: DC
Start: 2021-04-15 — End: 2021-08-12

## 2021-04-15 MED ORDER — ROSUVASTATIN CALCIUM 10 MG PO TABS: 10.0000 mg | ORAL_TABLET | Freq: Every day | ORAL | 1 refills | Status: DC
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
## 2021-04-15 NOTE — Patient Instructions (Signed)
Visit Information   Goals Addressed               This Visit's Progress     Patient Stated     Diabetes Management (pt-stated)        Patient Goals/Self-Care Activities patient will:  - take medications as prescribed check glucose twice daily, document, and provide at future appointments check blood pressure periodically, document, and provide at future appointments        Patient verbalizes understanding of instructions provided today and agrees to view in [place].   Plan: Telephone follow up appointment with care management team member scheduled for:  4 weeks  Catie Darnelle Maffucci, PharmD, [place], [place] Clinical Pharmacist [employer] at [employer] [phone]

## 2021-04-30 ENCOUNTER — Other Ambulatory Visit (INDEPENDENT_AMBULATORY_CARE_PROVIDER_SITE_OTHER): Payer: BC Managed Care – PPO

## 2021-04-30 ENCOUNTER — Other Ambulatory Visit: Payer: Self-pay

## 2021-04-30 DIAGNOSIS — I10 Essential (primary) hypertension: Secondary | ICD-10-CM | POA: Diagnosis not present

## 2021-04-30 LAB — COMPREHENSIVE METABOLIC PANEL
ALT: 41 U/L (ref 0–53)
AST: 25 U/L (ref 0–37)
Albumin: 4.2 g/dL (ref 3.5–5.2)
Alkaline Phosphatase: 83 U/L (ref 39–117)
BUN: 16 mg/dL (ref 6–23)
CO2: 33 mEq/L — ABNORMAL HIGH (ref 19–32)
Calcium: 9.5 mg/dL (ref 8.4–10.5)
Chloride: 103 mEq/L (ref 96–112)
Creatinine, Ser: 1.04 mg/dL (ref 0.40–1.50)
GFR: 77.17 mL/min (ref >60.00)
Glucose, Bld: 114 mg/dL — ABNORMAL HIGH (ref 70–99)
Potassium: 3.9 mEq/L (ref 3.5–5.1)
Sodium: 143 mEq/L (ref 135–145)
Total Bilirubin: 0.7 mg/dL (ref 0.2–1.2)
Total Protein: 7.5 g/dL (ref 6.0–8.3)

## 2021-05-13 ENCOUNTER — Ambulatory Visit: Payer: BC Managed Care – PPO | Admitting: Pharmacist

## 2021-05-13 DIAGNOSIS — E1165 Type 2 diabetes mellitus with hyperglycemia: Secondary | ICD-10-CM

## 2021-05-13 MED ORDER — CONTOUR NEXT TEST VI STRP: ORAL_STRIP | 12 refills | Status: DC

## 2021-05-13 NOTE — Chronic Care Management (AMB) (Signed)
Chronic Care Management CCM Pharmacy Note  05/13/2021 Name:  George Gordon MRN:  [id number] DOB:  Sep 12, 1960  Subjective: George Gordon is an 60 y.o. year old male who is a primary patient of Tullo, Aris Everts, MD.  The CCM team was consulted for assistance with disease management and care coordination needs.    Engaged with patient by telephone for follow up visit for pharmacy case management and/or care coordination services.   Objective:  Medications Reviewed Today     Reviewed by De Hollingshead, RPH-CPP (Pharmacist) on 05/13/21 at 1341  Med List Status: <None>   Medication Order Taking? Sig Documenting Provider Last Dose Status Informant  Cetirizine HCl (ZYRTEC ALLERGY) 10 MG CAPS [id number] Yes Take 1 capsule (10 mg total) by mouth daily. Crecencio Mc, MD Taking Active   Cholecalciferol (VITAMIN D) 50 MCG (2000 UT) CAPS [id number] Yes Take 2 capsules by mouth daily. [provider] Taking Active   empagliflozin (JARDIANCE) 10 MG TABS tablet [id number] Yes Take 1 tablet (10 mg total) by mouth daily before breakfast. Crecencio Mc, MD Taking Active   glucose blood test strip [id number]  Please dispense Contour Next test strips to check Blood glucose twice a day, (90 day supply) E11.9 Crecencio Mc, MD  Active   insulin degludec (TRESIBA FLEXTOUCH) 100 UNIT/ML FlexTouch Pen [id number] Yes Inject 20 Units into the skin daily. Crecencio Mc, MD Taking Active   Multiple Vitamin (MULTIVITAMIN) tablet [id number] Yes Take 1 tablet by mouth daily. [provider] Taking Active   rosuvastatin (CRESTOR) 10 MG tablet [id number] Yes Take 1 tablet (10 mg total) by mouth daily. Crecencio Mc, MD Taking Active   silodosin (RAPAFLO) 8 MG CAPS capsule [id number] Yes Take 8 mg by mouth daily with breakfast. [provider] Taking Active   telmisartan (MICARDIS) 20 MG tablet [id number] Yes Take 1 tablet (20 mg total) by mouth daily. Crecencio Mc, MD Taking Active              Pertinent Labs:   Lab Results  Component Value Date   HGBA1C 13.6 (A) 04/09/2021   Lab Results  Component Value Date   CHOL 234 (H) 04/09/2021   HDL 46.20 04/09/2021   LDLCALC 167 (H) 04/09/2021   LDLDIRECT 214.0 08/05/2016   TRIG 102.0 04/09/2021   CHOLHDL 5 04/09/2021   Lab Results  Component Value Date   CREATININE 1.04 04/30/2021   BUN 16 04/30/2021   NA 143 04/30/2021   K 3.9 04/30/2021   CL 103 04/30/2021   CO2 33 (H) 04/30/2021     SDOH:  (Social Determinants of Health) assessments and interventions performed: Yes SDOH Interventions    Flowsheet Row Most Recent Value  SDOH Interventions   Financial Strain Interventions Intervention Not Indicated       CCM Care Plan  Review of patient past medical history, allergies, medications, health status, including review of consultants reports, laboratory and other test data, was performed as part of comprehensive evaluation and provision of chronic care management services.   Conditions to be addressed/monitored:  Hypertension, Hyperlipidemia, and Diabetes  Care Plan : Medication Management  Updates made by De Hollingshead, RPH-CPP since 05/13/2021 12:00 AM     Problem: Diabetes, Hypertension, Hyperlipidemia      Long-Range Goal: Disease Progression Prevention   Start Date: 04/15/2021  Recent Progress: On track  Priority: High  Note:   Current Barriers:  Unable to achieve control of diabetes  Does not maintain  contact with provider office  Pharmacist Clinical Goal(s):  patient will achieve control of diabetes as evidenced by A1c  through collaboration with PharmD and provider.    Interventions: 1:1 collaboration with Crecencio Mc, MD regarding development and update of comprehensive plan of care as evidenced by provider attestation and co-signature Inter-disciplinary care team collaboration (see longitudinal plan of care) Comprehensive medication review performed; medication list  updated in electronic medical record  Diabetes:  Current treatment: Tresiba 20 units daily, Jardiance 10 mg daily  Does endorse feeling "better" with improvement in blood sugars.  History of metformin - rash Current glucose readings: fasting glucose: 100-130s; post prandial: not checking Advised to add post prandial checks. Advised on goal A1c, goal fasting, goal 2 hour post prandial.  Denies hypoglycemia Recommended to continue current regimen at this time  Hypertension:   Uncontrolled per last office visit  current treatment: telmisartan 20 mg daily  Current home readings: none; has a machine at home Recommended to continue current regimen at this time  Hyperlipidemia:   Uncontrolled per last lab work; current treatment: rosuvastatin 10 mg daily  Medications previously tried: atorvastatin - reported rash  Recommended to continue current regimen at this time. Follow for next lipid panel.   BPH: Improved per patient report; started on silodosin 8 mg daily by urology - [name].  Reports at last visit they discussed PSA elevation and [name] is not concerned, has f/u in 6 months Recommended to continue current regimen at this time. Follow up with urology   Supplements: Vitamin D 2000 units twice daily, multivitamin  Patient Goals/Self-Care Activities patient will:  - take medications as prescribed check glucose twice daily, document, and provide at future appointments check blood pressure periodically, document, and provide at future appointments       Plan: Face to Face appointment with care management team member scheduled for: 6 weeks  Catie Darnelle Maffucci, PharmD, [place], [place] Pharmacist [employer] at [facility] [phone]

## 2021-05-13 NOTE — Patient Instructions (Addendum)
Mr. Belcher,   Check your blood sugars twice daily:  1) Fasting, first thing in the morning before breakfast and  2) 2 hours after your largest meal.   For a goal A1c of less than 7%, goal fasting readings are less than 130 and goal 2 hour after meal readings are less than 180.   Continue Jardiance 10 mg daily and Tresiba 20 units daily.   Continue telmisartan 20 mg daily for blood pressure and rosuvastatin 10 mg daily for cholesterol.   Schedule your yearly diabetic eye exam and have those results faxed to our office.  Visit Information  The patient verbalized understanding of instructions, educational materials, and care plan provided today and agreed to receive a mailed copy of patient instructions, educational materials, and care plan.    Plan: Face to Face appointment with care management team member scheduled for: 6 weeks  Catie Darnelle Maffucci, PharmD, [place], [place] Clinical Pharmacist [employer] at [employer] [phone]

## 2021-07-02 ENCOUNTER — Ambulatory Visit: Payer: BC Managed Care – PPO

## 2021-07-08 ENCOUNTER — Other Ambulatory Visit: Payer: Self-pay

## 2021-07-08 ENCOUNTER — Ambulatory Visit: Payer: BC Managed Care – PPO | Admitting: Pharmacist

## 2021-07-08 VITALS — BP 138/82 | HR 86

## 2021-07-08 DIAGNOSIS — E1165 Type 2 diabetes mellitus with hyperglycemia: Secondary | ICD-10-CM | POA: Diagnosis not present

## 2021-07-08 DIAGNOSIS — E1169 Type 2 diabetes mellitus with other specified complication: Secondary | ICD-10-CM

## 2021-07-08 DIAGNOSIS — E785 Hyperlipidemia, unspecified: Secondary | ICD-10-CM | POA: Diagnosis not present

## 2021-07-08 MED ORDER — OZEMPIC (0.25 OR 0.5 MG/DOSE) 2 MG/1.5ML ~~LOC~~ SOPN: PEN_INJECTOR | SUBCUTANEOUS | 1 refills | Status: DC

## 2021-07-08 NOTE — Patient Instructions (Addendum)
Stop [place]. Start Ozempic 0.25 mg weekly for 4 weeks, then increase to 0.5 mg weekly. Continue Jardiance 10 mg daily.   This medication may cause stomach upset, queasiness, or constipation, especially when first starting. This generally improves over time. Call our office if these symptoms occur and worsen, or if you have severe symptoms such as vomiting, diarrhea, or stomach pain.   It can take a little while for the full benefit of the Ozempic to take effect, so you may see some higher sugars during the transition. Call me if you start to see fasting readings greater than 200.   Check your blood sugars twice daily:  1) Fasting, first thing in the morning before breakfast and  2) 2 hours after your largest meal.   For a goal A1c of less than 7%, goal fasting readings are less than 130 and goal 2 hour after meal readings are less than 180.   You can upload your eye exam results via [place].   When you see Dr. Derrel Nip, plan to ask about the 1) pneumonia vaccine and 2) the shingles vaccine series.   We recommend you get the updated bivalent COVID-19 booster, at least 2 months after any prior doses. You may consider delaying a booster dose by 3 months from a prior episode of COVID-19 per the CDC.   You can find pharmacies that have this formulation in stock at [email or web address]   Take care!  Catie Darnelle Maffucci, PharmD

## 2021-07-08 NOTE — Progress Notes (Addendum)
Chief Complaint  Patient presents with   Diabetes    George Gordon is a 60 y.o. year old male who was referred for medication management by their primary care provider, Crecencio Mc, MD. They presented for a face to face visit today.     Subjective: Diabetes:  Current medications: empagliflozin (Jardiance) 10 mg by mouth once daily and insulin degludec Tyler Aas) 20 units subcutaneously once daily  Medications tried in the past: metformin - rash  Current glucose readings: fasting: 90-140s, pending overnight snacks  Patient reports hypoglycemic s/sx including dizziness, shakiness, sweating periodically. More overnight snacks, though unclear if because he got into routine with the [place] or if he is having lows overnight.   Current meal patterns: breakfast: omelette with vegetables; lunch: baked chicken, supper: vegetables, rice; drink: water, grapejuice or cranberry juice  Physical activity: report little recently, stopped a lot of sports hobbies to spend more time at home with his son  Eye exam: reports he had, will find results  Hypertension:  Current medications: telmisartan 20 mg by mouth daily  Current blood pressure readings readings: 120s/60-80s  Patient denies hypotensive s/sx including dizziness, lightheadedness. Patient denies hypertensive symptoms including headache, chest pain, shortness of breath  Hyperlipidemia/ASCVD Risk Reduction  Current lipid lowering medications: rosuvastatin 10 mg   Health Maintenance  Yearly diabetic eye exam: due - reports he received in March, will send to our office Yearly diabetic foot exam: up to date Urine microalbumin: up to date Yearly influenza vaccination: up to date Td/Tdap vaccination: up to date Pneumonia vaccination: due - recommended to discuss with PCP COVID vaccinations: due - recommended to pursue Shingrix vaccinations: due - recommended to discuss with PCP Colonoscopy: due - recommended to discuss  with PCP  Objective: Lab Results  Component Value Date   HGBA1C 13.6 (A) 04/09/2021    Lab Results  Component Value Date   CREATININE 1.04 04/30/2021   BUN 16 04/30/2021   NA 143 04/30/2021   K 3.9 04/30/2021   CL 103 04/30/2021   CO2 33 (H) 04/30/2021    Lab Results  Component Value Date   CHOL 234 (H) 04/09/2021   HDL 46.20 04/09/2021   LDLCALC 167 (H) 04/09/2021   LDLDIRECT 214.0 08/05/2016   TRIG 102.0 04/09/2021   CHOLHDL 5 04/09/2021     Assessment/Plan:   Diabetes: - Currently uncontrolled but improved.  - Discussed transition from basal insulin to GLP1 given reports of hypoglycemic symptoms and desire to reduce snacking. Stop [place]. Start Ozempic 0.25 mg weekly for 4 weeks, then increase to 0.5 mg weekly. Continue Jardiance 10 mg daily.  - Reviewed goal A1c, goal fasting, and goal 2 hour post prandial glucose. Advised to contact me if fastings >200 with stopping [place] and starting Ozempic.  - Reviewed lifestyle modifications including increasing physical activity - Recommend to check glucose twice daily as able  Hypertension: - Currently controlled per home readings and clinic readings.  - Recommend to continue telmisartan 20 mg daily    Hyperlipidemia/ASCVD Risk Reduction: - Currently uncontrolled but anticipated to be improved - Recommend to continue rosuvastatin 10 mg daily  Health Maintenance - Discussed CDC/ACIP recommendations for COVID 19 booster vaccine(s). Recommended to pursue. Reviewed recommendations for Shingrix, Prevnar vaccinations - encouraged to discuss with PCP at next appointment.  - Assisted in scheduling next PCP appointment and fasting labs.   Follow Up Plan: PCP in 4 weeks, will follow for lab results to determine PharmD follow up  Catie Darnelle Maffucci, PharmD, [place], [place] Clinical Pharmacist [employer] at [employer] [phone]

## 2021-07-14 ENCOUNTER — Other Ambulatory Visit: Payer: Self-pay

## 2021-07-14 ENCOUNTER — Telehealth (INDEPENDENT_AMBULATORY_CARE_PROVIDER_SITE_OTHER): Payer: BC Managed Care – PPO | Admitting: Family Medicine

## 2021-07-14 DIAGNOSIS — U071 COVID-19: Secondary | ICD-10-CM

## 2021-07-14 MED ORDER — MOLNUPIRAVIR EUA 200MG CAPSULE: 4.0000 | ORAL_CAPSULE | Freq: Two times a day (BID) | ORAL | 0 refills | Status: AC

## 2021-07-14 NOTE — Progress Notes (Signed)
Patient ID: George Gordon, male   DOB: 11/28/1960, 61 y.o.   MRN: [id number]   This visit type was conducted due to national recommendations for restrictions regarding the COVID-19 pandemic in an effort to limit this patient's exposure and mitigate transmission in our community.   Virtual Visit via Video Note  I connected with [name] on 07/14/21 at  3:30 PM EST by a video enabled telemedicine application and verified that I am speaking with the correct person using two identifiers.  Location patient: home Location provider:work or home office Persons participating in the virtual visit: patient, provider  I discussed the limitations of evaluation and management by telemedicine and the availability of in person appointments. The patient expressed understanding and agreed to proceed.   HPI:  Patient relates positive COVID test earlier today.  He states he was at his primary care provider's office on December 28 meeting with a pharmacist.  There was another patient who apparently approached him in the waiting area who had recently had [place].  Patient tried to keep his distance to no avail.  He states that by the next day around he developed some sweats and headaches and body aches.  Relatively mild cough.  Has had frequent chills.  He has decreased appetite.  No dyspnea.  He has comorbidities of type 2 diabetes and hypertension.  He also states his 49-year-old son was tested earlier today and positive  ROS: See pertinent positives and negatives per HPI.  Past Medical History:  Diagnosis Date   Diabetes mellitus without complication ([place])    Hyperlipidemia    Hypertension     Past Surgical History:  Procedure Laterality Date   CATARACT EXTRACTION W/PHACO Right 12/21/2016   Procedure: CATARACT EXTRACTION PHACO AND INTRAOCULAR LENS PLACEMENT (IOC);  Surgeon: Birder Robson, MD;  Location: [facility] ORS;  Service: Ophthalmology;  Laterality: Right;  [place] 00:27 AP% 11.3 CDE 3.08 fluid pack  lot # [id number]   CATARACT EXTRACTION W/PHACO Left 01/11/2017   Procedure: CATARACT EXTRACTION PHACO AND INTRAOCULAR LENS PLACEMENT (IOC);  Surgeon: Birder Robson, MD;  Location: [facility] ORS;  Service: Ophthalmology;  Laterality: Left;  [place]  00:30 AP% 13.0 CDE 3.93 Fluid pack lot # [id number]   COLONOSCOPY     NO PAST SURGERIES      Family History  Problem Relation Age of Onset   Diabetes Brother     SOCIAL HX: Non-smoker   Current Outpatient Medications:    molnupiravir EUA (LAGEVRIO) 200 mg CAPS capsule, Take 4 capsules (800 mg total) by mouth 2 (two) times daily for 5 days., Disp: 40 capsule, Rfl: 0   Cetirizine HCl (ZYRTEC ALLERGY) 10 MG CAPS, Take 1 capsule (10 mg total) by mouth daily., Disp: 90 capsule, Rfl: 3   Cholecalciferol (VITAMIN D) 50 MCG (2000 UT) CAPS, Take 2 capsules by mouth daily., Disp: , Rfl:    empagliflozin (JARDIANCE) 10 MG TABS tablet, Take 1 tablet (10 mg total) by mouth daily before breakfast., Disp: 90 tablet, Rfl: 1   glucose blood (CONTOUR NEXT TEST) test strip, Use to check blood sugar twice daily, Disp: 100 each, Rfl: 12   Multiple Vitamin (MULTIVITAMIN) tablet, Take 1 tablet by mouth daily., Disp: , Rfl:    rosuvastatin (CRESTOR) 10 MG tablet, Take 1 tablet (10 mg total) by mouth daily., Disp: 90 tablet, Rfl: 1   Semaglutide,0.25 or 0.5MG /DOS, (OZEMPIC, 0.25 OR 0.5 MG/DOSE,) 2 MG/1.5ML SOPN, Inject 0.25 mg weekly for 4 weeks then increase to 0.5 mg weekly thereafer, Disp: 4.5  mL, Rfl: 1   silodosin (RAPAFLO) 8 MG CAPS capsule, Take 8 mg by mouth daily with breakfast., Disp: , Rfl:    telmisartan (MICARDIS) 20 MG tablet, Take 1 tablet (20 mg total) by mouth daily., Disp: 90 tablet, Rfl: 1  EXAM:  VITALS per patient if applicable:  GENERAL: alert, oriented, appears well and in no acute distress  HEENT: atraumatic, conjunttiva clear, no obvious abnormalities on inspection of external nose and ears  NECK: normal movements of the head and neck  LUNGS:  on inspection no signs of respiratory distress, breathing rate appears normal, no obvious gross SOB, gasping or wheezing  CV: no obvious cyanosis  MS: moves all visible extremities without noticeable abnormality  PSYCH/NEURO: pleasant and cooperative, no obvious depression or anxiety, speech and thought processing grossly intact  ASSESSMENT AND PLAN:  Discussed the following assessment and plan:  COVID-19 infection.  Patient is nearing day 5.  He does have comorbidities as above.  He is in no distress.  We discussed pros and cons of antiviral therapy.  We agreed to start Molnupiravir 4 capsules by mouth twice daily for 5 days and plenty of fluids and rest.  Consider pulse oximeter to monitor oxygen levels     I discussed the assessment and treatment plan with the patient. The patient was provided an opportunity to ask questions and all were answered. The patient agreed with the plan and demonstrated an understanding of the instructions.   The patient was advised to call back or seek an in-person evaluation if the symptoms worsen or if the condition fails to improve as anticipated.     Carolann Littler, MD

## 2021-07-20 ENCOUNTER — Encounter: Payer: Self-pay | Admitting: Family Medicine

## 2021-08-10 ENCOUNTER — Other Ambulatory Visit (INDEPENDENT_AMBULATORY_CARE_PROVIDER_SITE_OTHER): Payer: BC Managed Care – PPO

## 2021-08-10 DIAGNOSIS — E1165 Type 2 diabetes mellitus with hyperglycemia: Secondary | ICD-10-CM

## 2021-08-10 LAB — HEMOGLOBIN A1C: Hgb A1c MFr Bld: 9.4 % — ABNORMAL HIGH (ref 4.6–6.5)

## 2021-08-12 ENCOUNTER — Encounter: Payer: Self-pay | Admitting: Internal Medicine

## 2021-08-12 ENCOUNTER — Other Ambulatory Visit: Payer: Self-pay

## 2021-08-12 ENCOUNTER — Ambulatory Visit: Payer: BC Managed Care – PPO | Admitting: Internal Medicine

## 2021-08-12 VITALS — BP 126/66 | HR 80 | Temp 97.8°F | Ht 68.0 in | Wt 145.8 lb

## 2021-08-12 DIAGNOSIS — Z113 Encounter for screening for infections with a predominantly sexual mode of transmission: Secondary | ICD-10-CM | POA: Diagnosis not present

## 2021-08-12 DIAGNOSIS — I10 Essential (primary) hypertension: Secondary | ICD-10-CM | POA: Diagnosis not present

## 2021-08-12 DIAGNOSIS — Z23 Encounter for immunization: Secondary | ICD-10-CM

## 2021-08-12 DIAGNOSIS — E1165 Type 2 diabetes mellitus with hyperglycemia: Secondary | ICD-10-CM | POA: Diagnosis not present

## 2021-08-12 MED ORDER — ZOSTER VAC RECOMB ADJUVANTED 50 MCG/0.5ML IM SUSR: 0.5000 mL | Freq: Once | INTRAMUSCULAR | 1 refills | Status: AC

## 2021-08-12 MED ORDER — EMPAGLIFLOZIN 25 MG PO TABS: 25.0000 mg | ORAL_TABLET | Freq: Every day | ORAL | 1 refills | Status: DC

## 2021-08-12 NOTE — Patient Instructions (Addendum)
Check sugar either before dinner or 2 hours after  DINNER   Increase jardiance to 2 tablets daily (20 mg) until supplies are gone.  New rx on file at [facility] is for 25 mg daily    Continue 0.5 mg for now on the ozempic.   Check with your insurance about the cologuard test as a covered screening test for colon cancer because you are OVERDUE    The ShingRx vaccine is now available in local pharmacies and is much more protective than the old one  Zostavax  (it is about 97%  Effective in preventing shingles). .   It is therefore ADVISED for all interested adults over 50 to prevent shingles so I have printed you a prescription for it.  (it requires a 2nd dose 2 too 6 months after the first one) .  It will cause you to have flu  like symptoms for 2 days

## 2021-08-12 NOTE — Assessment & Plan Note (Addendum)
HIs a1c is improved b remains elevated.  Increase jardiance to 25 mg daily .  Continue ozempic 0.5 mg weekly,

## 2021-08-12 NOTE — Assessment & Plan Note (Addendum)
Improved control with ozempic and jardiance, but not at goal.  Continue ozempic  , increase  Jardiance,   telmisartan 20 mg daily .

## 2021-08-12 NOTE — Assessment & Plan Note (Signed)
Well controlled on current regimen. Renal function stable, no changes today. 

## 2021-08-12 NOTE — Progress Notes (Signed)
Subjective:  Patient ID: George Gordon, male    DOB: 1961/06/19  Age: 61 y.o. MRN: [id number]  CC: The primary encounter diagnosis was Screen for STD (sexually transmitted disease). Diagnoses of Type 2 diabetes mellitus with hyperglycemia, unspecified whether long term insulin use ([place]), Need for pneumococcal vaccination, and Essential hypertension were also pertinent to this visit.   This visit occurred during the SARS-CoV-2 public health emergency.  Safety protocols were in place, including screening questions prior to the visit, additional usage of staff PPE, and extensive cleaning of exam room while observing appropriate contact time as indicated for disinfecting solutions.    HPI George Gordon presents for  Chief Complaint  Patient presents with   Follow-up    3 month follow up on diabetes and hypertension     1) Uncontrolled type 2 DM:  last a1c was  13.6  I n September,  and repeat yesterday was 9.4  Fasting sugars ave been mostly under 140 with occasional higher readings due to snacking at night on bananas .  Not checking any other times.   Taking ozempic 0.5 mg weekly for < 4 weeks.  Jardiance 10 mg .. Following a carbohydrate modified diet 6 days per week. Denies numbness, burning and tingling of extremities. Appetite is good. > 140/80 at rest . Patient is following a reduce salt diet most days and is taking medications as prescribed   Outpatient Medications Prior to Visit  Medication Sig Dispense Refill   Cetirizine HCl (ZYRTEC ALLERGY) 10 MG CAPS Take 1 capsule (10 mg total) by mouth daily. 90 capsule 3   cholecalciferol (VITAMIN D3) 25 MCG (1000 UNIT) tablet Take 1,000 Units by mouth daily.     glucose blood (CONTOUR NEXT TEST) test strip Use to check blood sugar twice daily 100 each 12   rosuvastatin (CRESTOR) 10 MG tablet Take 1 tablet (10 mg total) by mouth daily. 90 tablet 1   Semaglutide,0.25 or 0.5MG /DOS, (OZEMPIC, 0.25 OR 0.5 MG/DOSE,) 2 MG/1.5ML SOPN Inject 0.25 mg  weekly for 4 weeks then increase to 0.5 mg weekly thereafer 4.5 mL 1   silodosin (RAPAFLO) 8 MG CAPS capsule Take 8 mg by mouth daily with breakfast.     telmisartan (MICARDIS) 20 MG tablet Take 1 tablet (20 mg total) by mouth daily. 90 tablet 1   zinc gluconate 50 MG tablet Take 50 mg by mouth daily.     empagliflozin (JARDIANCE) 10 MG TABS tablet Take 1 tablet (10 mg total) by mouth daily before breakfast. 90 tablet 1   Cholecalciferol (VITAMIN D) 50 MCG (2000 UT) CAPS Take 2 capsules by mouth daily.     Multiple Vitamin (MULTIVITAMIN) tablet Take 1 tablet by mouth daily.     No facility-administered medications prior to visit.    Review of Systems;  Patient denies headache, fevers, malaise, unintentional weight loss, skin rash, eye pain, sinus congestion and sinus pain, sore throat, dysphagia,  hemoptysis , cough, dyspnea, wheezing, chest pain, palpitations, orthopnea, edema, abdominal pain, nausea, melena, diarrhea, constipation, flank pain, dysuria, hematuria, urinary  Frequency, nocturia, numbness, tingling, seizures,  Focal weakness, Loss of consciousness,  Tremor, insomnia, depression, anxiety, and suicidal ideation.      Objective:  BP 126/66 (BP Location: Left Arm, Patient Position: Sitting, Cuff Size: Normal)    Pulse 80    Temp 97.8 F (36.6 C) (Oral)    Ht 5\' 8"  (1.727 m)    Wt 145 lb 12.8 oz (66.1 kg)    SpO2 96%  BMI 22.17 kg/m   BP Readings from Last 3 Encounters:  08/12/21 126/66  07/08/21 138/82  04/09/21 138/86    Wt Readings from Last 3 Encounters:  08/12/21 145 lb 12.8 oz (66.1 kg)  04/09/21 143 lb 12.8 oz (65.2 kg)  12/28/19 151 lb (68.5 kg)    General appearance: alert, cooperative and appears stated age Ears: normal TM's and external ear canals both ears Throat: lips, mucosa, and tongue normal; teeth and gums normal Neck: no adenopathy, no carotid bruit, supple, symmetrical, trachea midline and thyroid not enlarged, symmetric, no  tenderness/mass/nodules Back: symmetric, no curvature. ROM normal. No CVA tenderness. Lungs: clear to auscultation bilaterally Heart: regular rate and rhythm, S1, S2 normal, no murmur, click, rub or gallop Abdomen: soft, non-tender; bowel sounds normal; no masses,  no organomegaly Pulses: 2+ and symmetric Skin: Skin color, texture, turgor normal. No rashes or lesions Lymph nodes: Cervical, supraclavicular, and axillary nodes normal.  Lab Results  Component Value Date   HGBA1C 9.4 (H) 08/10/2021   HGBA1C 13.6 (A) 04/09/2021   HGBA1C 8.3 (H) 05/11/2019    Lab Results  Component Value Date   CREATININE 1.04 04/30/2021   CREATININE 1.02 04/09/2021   CREATININE 1.14 05/11/2019    Lab Results  Component Value Date   WBC 5.5 05/11/2019   HGB 16.3 05/11/2019   HCT 49.3 05/11/2019   PLT 169.0 05/11/2019   GLUCOSE 114 (H) 04/30/2021   CHOL 234 (H) 04/09/2021   TRIG 102.0 04/09/2021   HDL 46.20 04/09/2021   LDLDIRECT 214.0 08/05/2016   LDLCALC 167 (H) 04/09/2021   ALT 41 04/30/2021   AST 25 04/30/2021   NA 143 04/30/2021   K 3.9 04/30/2021   CL 103 04/30/2021   CREATININE 1.04 04/30/2021   BUN 16 04/30/2021   CO2 33 (H) 04/30/2021   TSH 0.84 09/21/2017   PSA 1.95 04/09/2021   HGBA1C 9.4 (H) 08/10/2021   MICROALBUR 0.8 04/09/2021    DG Chest 2 View  Result Date: 12/28/2019 CLINICAL DATA:  sob with cough 12/21/19 with fever, Productive cough, Dyspnea on exertion. Hx diabetic. Nonsmoker EXAM: CHEST - 2 VIEW COMPARISON:  Chest radiograph 06/30/2010 FINDINGS: The heart size and mediastinal contours are within normal limits. The lungs are clear. No pneumothorax or pleural effusion. The visualized skeletal structures are unremarkable. IMPRESSION: No evidence of active disease. Electronically Signed   By: Audie Pinto M.D.   On: 12/28/2019 17:12    Assessment & Plan:   Problem List Items Addressed This Visit     Essential hypertension    Well controlled on current regimen.  Renal function stable, no changes today.      Other Visit Diagnoses     Screen for STD (sexually transmitted disease)    -  Primary   Relevant Orders   HIV Antibody (routine testing w rflx)   Type 2 diabetes mellitus with hyperglycemia, unspecified whether long term insulin use ([facility])       Relevant Medications   empagliflozin (JARDIANCE) 25 MG TABS tablet   Other Relevant Orders   Hemoglobin A1c   Comprehensive metabolic panel   Lipid panel   Need for pneumococcal vaccination       Relevant Orders   Pneumococcal conjugate vaccine 20-valent (Prevnar 20) (Completed)       I spent 30 minutes dedicated to the care of this patient on the date of this encounter to include pre-visit review of patient's medical history,  most recent imaging studies, Face-to-face time with the  patient , and post visit ordering of testing and therapeutics.    Follow-up: No follow-ups on file.   Crecencio Mc, MD

## 2021-08-13 ENCOUNTER — Other Ambulatory Visit: Payer: Self-pay

## 2021-08-13 ENCOUNTER — Other Ambulatory Visit (INDEPENDENT_AMBULATORY_CARE_PROVIDER_SITE_OTHER): Payer: BC Managed Care – PPO

## 2021-08-13 DIAGNOSIS — Z113 Encounter for screening for infections with a predominantly sexual mode of transmission: Secondary | ICD-10-CM

## 2021-08-13 DIAGNOSIS — E1165 Type 2 diabetes mellitus with hyperglycemia: Secondary | ICD-10-CM

## 2021-08-13 LAB — COMPREHENSIVE METABOLIC PANEL
ALT: 31 U/L (ref 0–53)
AST: 21 U/L (ref 0–37)
Albumin: 4.3 g/dL (ref 3.5–5.2)
Alkaline Phosphatase: 67 U/L (ref 39–117)
BUN: 16 mg/dL (ref 6–23)
CO2: 32 mEq/L (ref 19–32)
Calcium: 9.7 mg/dL (ref 8.4–10.5)
Chloride: 102 mEq/L (ref 96–112)
Creatinine, Ser: 1.05 mg/dL (ref 0.40–1.50)
GFR: 76.13 mL/min (ref >60.00)
Glucose, Bld: 124 mg/dL — ABNORMAL HIGH (ref 70–99)
Potassium: 3.6 mEq/L (ref 3.5–5.1)
Sodium: 141 mEq/L (ref 135–145)
Total Bilirubin: 1.4 mg/dL — ABNORMAL HIGH (ref 0.2–1.2)
Total Protein: 7.5 g/dL (ref 6.0–8.3)

## 2021-08-13 LAB — HEMOGLOBIN A1C: Hgb A1c MFr Bld: 9.2 % — ABNORMAL HIGH (ref 4.6–6.5)

## 2021-08-13 LAB — LIPID PANEL
Cholesterol: 121 mg/dL (ref 0–200)
HDL: 40.8 mg/dL (ref >39.00)
LDL Cholesterol: 68 mg/dL (ref 0–99)
NonHDL: 80.59
Total CHOL/HDL Ratio: 3
Triglycerides: 64 mg/dL (ref 0.0–149.0)
VLDL: 12.8 mg/dL (ref 0.0–40.0)

## 2021-08-14 LAB — HIV ANTIBODY (ROUTINE TESTING W REFLEX): HIV 1&2 Ab, 4th Generation: NONREACTIVE

## 2021-09-15 ENCOUNTER — Telehealth: Payer: Self-pay | Admitting: Pharmacist

## 2021-09-15 NOTE — Telephone Encounter (Signed)
Please call patient and see if appointment can be moved to this week. 30 min.  ?

## 2021-09-17 ENCOUNTER — Telehealth (INDEPENDENT_AMBULATORY_CARE_PROVIDER_SITE_OTHER): Payer: BC Managed Care – PPO | Admitting: Pharmacist

## 2021-09-17 DIAGNOSIS — E1165 Type 2 diabetes mellitus with hyperglycemia: Secondary | ICD-10-CM

## 2021-09-17 MED ORDER — OZEMPIC (1 MG/DOSE) 4 MG/3ML ~~LOC~~ SOPN: 1.0000 mg | PEN_INJECTOR | SUBCUTANEOUS | 2 refills | Status: DC

## 2021-09-17 NOTE — Progress Notes (Signed)
? ?   ? ?Chief Complaint  ?Patient presents with  ? Diabetes  ? ? ?George Gordon is a 61 y.o. year old male who was referred for medication management by their primary care provider, Crecencio Mc, MD. They presented for a virtual visit in the context of the COVID-19 pandemic. ?  ?I connected with [place] on 09/17/21 at 3:30 pm by video and verified that I am speaking with the correct person using two identifiers. ?  ?I discussed the limitations, risks, security and privacy concerns of performing an evaluation and management service by video and the availability of in person appointments. I also discussed with the patient that there may be a patient responsible charge related to this service. The patient expressed understanding and agreed to proceed. ?  ?Patient location:  home ?My Location:  clinic ?Persons on the video call:  myself and patient  ? ?Subjective: ?Diabetes: ? ?Current medications: Jardiance 25 mg daily - finishing Jardiance 10 mg supply by taking 2 tablets; Ozempic 0.5 mg weekly  ?Medications tried in the past: metformin - rash ? ?Denies any intolerability since increasing Jardiance dose.  ? ?Current glucose readings: fasting: 90-114s, not checking post prandials ? ?Hypertension: ? ?Current medications: telmisartan 20 mg daily ? ? ?Hyperlipidemia/ASCVD Risk Reduction ? ?Current lipid lowering medications: rosuvastatin 10 mg daily ? ?Denies intolerability concerns ? ? ?Objective: ?Lab Results  ?Component Value Date  ? HGBA1C 9.2 (H) 08/13/2021  ? ? ?Lab Results  ?Component Value Date  ? CREATININE 1.05 08/13/2021  ? BUN 16 08/13/2021  ? NA 141 08/13/2021  ? K 3.6 08/13/2021  ? CL 102 08/13/2021  ? CO2 32 08/13/2021  ? ? ?Lab Results  ?Component Value Date  ? CHOL 121 08/13/2021  ? HDL 40.80 08/13/2021  ? [place] 68 08/13/2021  ? LDLDIRECT 214.0 08/05/2016  ? TRIG 64.0 08/13/2021  ? CHOLHDL 3 08/13/2021  ? ? ?Medications Reviewed Today   ? ? Reviewed by Adair Laundry, [place] (Certified Medical  Assistant) on 08/12/21 at [place] List Status: <None>  ? ?Medication Order Taking? Sig Documenting Provider Last Dose Status Informant  ?Cetirizine HCl (ZYRTEC ALLERGY) 10 MG CAPS [id number] Yes Take 1 capsule (10 mg total) by mouth daily. Crecencio Mc, MD Taking Active   ?cholecalciferol (VITAMIN D3) 25 MCG (1000 UNIT) tablet [id number] Yes Take 1,000 Units by mouth daily. [provider] Taking Active   ?empagliflozin (JARDIANCE) 10 MG TABS tablet [id number] Yes Take 1 tablet (10 mg total) by mouth daily before breakfast. Crecencio Mc, MD Taking Active   ?glucose blood (CONTOUR NEXT TEST) test strip [id number] Yes Use to check blood sugar twice daily Crecencio Mc, MD Taking Active   ?rosuvastatin (CRESTOR) 10 MG tablet [id number] Yes Take 1 tablet (10 mg total) by mouth daily. Crecencio Mc, MD Taking Active   ?Semaglutide,0.25 or 0.'5MG'$ /DOS, (OZEMPIC, 0.25 OR 0.5 MG/DOSE,) 2 MG/1.5ML SOPN [id number] Yes Inject 0.25 mg weekly for 4 weeks then increase to 0.5 mg weekly thereafer Crecencio Mc, MD Taking Active   ?silodosin (RAPAFLO) 8 MG CAPS capsule [id number] Yes Take 8 mg by mouth daily with breakfast. [provider] Taking Active   ?telmisartan (MICARDIS) 20 MG tablet [id number] Yes Take 1 tablet (20 mg total) by mouth daily. Crecencio Mc, MD Taking Active   ?zinc gluconate 50 MG tablet [id number] Yes Take 50 mg by mouth daily. [provider] Taking Active   ? ?  ?  ? ?  ? ? ?  Assessment/Plan:  ? ?Diabetes: ?- Currently uncontrolled ?- Reviewed goal A1c, goal fasting, and goal 2 hour post prandial glucose. Encouraged periodic post prandial checks. Discussed CGM, patient not interested at this time. Worries his toddler son will knock it off. Discussed Skin Grips. Encouraged to consider.  ?- Dose increase of Jardiance unlikely to be enough to get A1c to goal. Recommend increasing Ozempic to 1 mg weekly. Patient amenable. Script sent to pharmacy ?- Recommend to check glucose  twice daily. Follow up with PCP as scheduled ? ?Hypertension: ?- Currently controlled ?- Recommend to continue current regimen at this time  ? ?Hyperlipidemia/ASCVD Risk Reduction: ?- Currently controlled.  ?- Recommend to continue current regimen at this time  ? ?Follow Up Plan: follow up with PCP as scheduled ? ?Catie Darnelle Maffucci, PharmD, BCACP, CPP ?Clinical Pharmacist ?Therapist, music at [employer] ?[phone] ? ? ? ?

## 2021-09-17 NOTE — Patient Instructions (Signed)
George Gordon,  ? ?Increase Ozempic to 1 mg weekly. You can use two doses of 0.5 mg, once after the other, each week to finish your current supply.  ? ?Continue Jardiance 25 mg daily ? ?Check your blood sugars twice daily:  ?1) Fasting, first thing in the morning before breakfast and  ?2) 2 hours after your largest meal.  ? ?For a goal A1c of less than 7%, goal fasting readings are less than 130 and goal 2 hour after meal readings are less than 180.  ? ?Think about the FreeStyle Libre 3. They make bandages called Skin Grips that go over top of the sensor to help protect it.  ? ?Keep up the great work! It was a pleasure working with you. ? ?Catie Darnelle Maffucci, PharmD ?

## 2021-09-24 ENCOUNTER — Telehealth: Payer: BC Managed Care – PPO

## 2021-10-02 ENCOUNTER — Other Ambulatory Visit: Payer: Self-pay | Admitting: Internal Medicine

## 2021-10-02 DIAGNOSIS — E1169 Type 2 diabetes mellitus with other specified complication: Secondary | ICD-10-CM

## 2021-10-05 ENCOUNTER — Other Ambulatory Visit: Payer: Self-pay | Admitting: Internal Medicine

## 2021-10-05 DIAGNOSIS — E1165 Type 2 diabetes mellitus with hyperglycemia: Secondary | ICD-10-CM

## 2021-11-11 ENCOUNTER — Telehealth: Payer: Self-pay | Admitting: Internal Medicine

## 2021-11-11 DIAGNOSIS — E785 Hyperlipidemia, unspecified: Secondary | ICD-10-CM

## 2021-11-11 DIAGNOSIS — I10 Essential (primary) hypertension: Secondary | ICD-10-CM

## 2021-11-11 NOTE — Telephone Encounter (Signed)
Patient has a lab appt 11/20/2021, there are no orders in. ?

## 2021-11-12 NOTE — Addendum Note (Signed)
Addended by: Crecencio Mc on: 11/12/2021 03:26 PM ? ? Modules accepted: Orders ? ?

## 2021-11-13 ENCOUNTER — Other Ambulatory Visit: Payer: Self-pay

## 2021-11-13 ENCOUNTER — Telehealth: Payer: Self-pay | Admitting: Internal Medicine

## 2021-11-13 MED ORDER — OZEMPIC (1 MG/DOSE) 4 MG/3ML ~~LOC~~ SOPN
1.0000 mg | PEN_INJECTOR | SUBCUTANEOUS | 2 refills | Status: DC
Start: 2021-11-13 — End: 2021-11-23

## 2021-11-13 NOTE — Telephone Encounter (Signed)
Pt need refill on ozempic sent to [facility] [address] ?

## 2021-11-13 NOTE — Telephone Encounter (Signed)
Pt called and notified that refill [facility] to [facility].  ?

## 2021-11-20 ENCOUNTER — Telehealth: Payer: Self-pay

## 2021-11-20 ENCOUNTER — Ambulatory Visit: Payer: BC Managed Care – PPO | Admitting: Internal Medicine

## 2021-11-20 ENCOUNTER — Other Ambulatory Visit (INDEPENDENT_AMBULATORY_CARE_PROVIDER_SITE_OTHER): Payer: BC Managed Care – PPO

## 2021-11-20 DIAGNOSIS — I10 Essential (primary) hypertension: Secondary | ICD-10-CM | POA: Diagnosis not present

## 2021-11-20 DIAGNOSIS — E785 Hyperlipidemia, unspecified: Secondary | ICD-10-CM | POA: Diagnosis not present

## 2021-11-20 DIAGNOSIS — E1169 Type 2 diabetes mellitus with other specified complication: Secondary | ICD-10-CM

## 2021-11-20 LAB — COMPREHENSIVE METABOLIC PANEL
ALT: 41 U/L (ref 0–53)
AST: 23 U/L (ref 0–37)
Albumin: 4.2 g/dL (ref 3.5–5.2)
Alkaline Phosphatase: 66 U/L (ref 39–117)
BUN: 15 mg/dL (ref 6–23)
CO2: 29 mEq/L (ref 19–32)
Calcium: 9.2 mg/dL (ref 8.4–10.5)
Chloride: 105 mEq/L (ref 96–112)
Creatinine, Ser: 1.05 mg/dL (ref 0.40–1.50)
GFR: 75.99 mL/min (ref >60.00)
Glucose, Bld: 126 mg/dL — ABNORMAL HIGH (ref 70–99)
Potassium: 3.6 mEq/L (ref 3.5–5.1)
Sodium: 140 mEq/L (ref 135–145)
Total Bilirubin: 1.2 mg/dL (ref 0.2–1.2)
Total Protein: 7.1 g/dL (ref 6.0–8.3)

## 2021-11-20 LAB — LIPID PANEL
Cholesterol: 131 mg/dL (ref 0–200)
HDL: 41.8 mg/dL (ref >39.00)
LDL Cholesterol: 78 mg/dL (ref 0–99)
NonHDL: 89.24
Total CHOL/HDL Ratio: 3
Triglycerides: 54 mg/dL (ref 0.0–149.0)
VLDL: 10.8 mg/dL (ref 0.0–40.0)

## 2021-11-20 LAB — HEMOGLOBIN A1C: Hgb A1c MFr Bld: 7.1 % — ABNORMAL HIGH (ref 4.6–6.5)

## 2021-11-20 LAB — LDL CHOLESTEROL, DIRECT: Direct LDL: 82 mg/dL

## 2021-11-20 NOTE — Telephone Encounter (Signed)
Patient went to pick up his [facility]pic from [facility], they only gave him one and he was told from Dr Lupita Dawn office that 2 were ordered. ?

## 2021-11-20 NOTE — Telephone Encounter (Signed)
Patient states he came in for a visit earlier today and when he went to pick up his medication for Ozempic he only had one.  Patient states previously he was given three at the time.  Patient states he would like to know if he can get two more. ? ?Patient states he is out of Ozempic currently. ? ?[facility] [address]. ?

## 2021-11-23 ENCOUNTER — Encounter: Payer: Self-pay | Admitting: Internal Medicine

## 2021-11-23 ENCOUNTER — Ambulatory Visit (INDEPENDENT_AMBULATORY_CARE_PROVIDER_SITE_OTHER): Payer: BC Managed Care – PPO | Admitting: Internal Medicine

## 2021-11-23 VITALS — BP 122/64 | HR 82 | Temp 97.6°F | Ht 68.0 in | Wt 142.2 lb

## 2021-11-23 DIAGNOSIS — E11319 Type 2 diabetes mellitus with unspecified diabetic retinopathy without macular edema: Secondary | ICD-10-CM

## 2021-11-23 DIAGNOSIS — E1169 Type 2 diabetes mellitus with other specified complication: Secondary | ICD-10-CM | POA: Diagnosis not present

## 2021-11-23 DIAGNOSIS — I10 Essential (primary) hypertension: Secondary | ICD-10-CM

## 2021-11-23 DIAGNOSIS — R972 Elevated prostate specific antigen [PSA]: Secondary | ICD-10-CM

## 2021-11-23 DIAGNOSIS — Z1211 Encounter for screening for malignant neoplasm of colon: Secondary | ICD-10-CM | POA: Diagnosis not present

## 2021-11-23 DIAGNOSIS — E1165 Type 2 diabetes mellitus with hyperglycemia: Secondary | ICD-10-CM

## 2021-11-23 DIAGNOSIS — E785 Hyperlipidemia, unspecified: Secondary | ICD-10-CM

## 2021-11-23 DIAGNOSIS — F4321 Adjustment disorder with depressed mood: Secondary | ICD-10-CM

## 2021-11-23 LAB — PSA: PSA: 1.72 ng/mL (ref 0.10–4.00)

## 2021-11-23 MED ORDER — EMPAGLIFLOZIN 25 MG PO TABS: 25.0000 mg | ORAL_TABLET | Freq: Every day | ORAL | 1 refills | Status: DC

## 2021-11-23 MED ORDER — OZEMPIC (1 MG/DOSE) 4 MG/3ML ~~LOC~~ SOPN: 1.0000 mg | PEN_INJECTOR | SUBCUTANEOUS | 2 refills | Status: DC

## 2021-11-23 NOTE — Telephone Encounter (Signed)
Pt called stating he at the pharmacy and wanted to talk to the cma and I let him know that they was with patients. I let pt know that they will call him back when they are finished.  ?

## 2021-11-23 NOTE — Progress Notes (Signed)
? ?Subjective:  ?Patient ID: George Gordon, male    DOB: 05-Oct-1960  Age: 61 y.o. MRN: [id number] ? ?CC: The primary encounter diagnosis was Essential hypertension. Diagnoses of Hyperlipidemia due to type 2 diabetes mellitus (George Gordon), Type 2 diabetes mellitus with hyperglycemia, unspecified whether long term insulin use (George Gordon), Colon cancer screening, Increased prostate specific antigen (PSA) velocity, Type 2 diabetes mellitus with retinopathy without macular edema, without long-term current use of insulin, unspecified laterality, unspecified retinopathy severity (George Gordon), and Grief were also pertinent to this visit. ? ? ?HPI ?George Gordon presents for diabetes follow up  ?Chief Complaint  ?Patient presents with  ? Follow-up  ?  3 month follow up   ? ? ?1)  T2DM:  uncontrolled by last visit , A1c was 9.2  . He feels generally well, is exercising several times per week and checking blood sugars once daily at variable times.  BS have been under 130 fasting and < 150 post prandially.  Denies any recent hypoglyemic events.  Tolerating 1 mg weekly dose of ozempic and 10 mg jardiance  without adverse effects. Has lost 3 lbs. . Following a carbohydrate modified diet 6 days per week. Denies numbness, burning and tingling of extremities. Appetite is good.   Last Eye exam: 2020/10/19 due now.   Was referred in 20-Oct-2019 for retinopathy, has seen specialist in [place]:  no retinopathy  history of LASIK surgery in October 19, 2020 bilaterally    ? ?2) Grief : lost older brother due to diabetes complications,  followed by the loss of his younger sister who died of liver cancer , both died in 10-20-2022. Both live  in [place]. Had to drop a class due to the emotional stress.  Was unable to attend their funerals due to his son's anxiety over even a brief separation from him  ,  ( he is 61 yrs old) . He has cone to terms with  their death    ? ?3) HTN:  taking telmisartan.  Home readings 122  ?4) Increased PSA velocity:  referred to urology . Repeat psa  needed today no records available  ? ?Outpatient Medications Prior to Visit  ?Medication Sig Dispense Refill  ? Cetirizine HCl (ZYRTEC ALLERGY) 10 MG CAPS Take 1 capsule (10 mg total) by mouth daily. 90 capsule 3  ? cholecalciferol (VITAMIN D3) 25 MCG (1000 UNIT) tablet Take 1,000 Units by mouth daily.    ? glucose blood (CONTOUR NEXT TEST) test strip Use to check blood sugar twice daily 100 each 12  ? rosuvastatin (CRESTOR) 10 MG tablet TAKE 1 TABLET BY MOUTH EVERY DAY 90 tablet 1  ? silodosin (RAPAFLO) 8 MG CAPS capsule Take 8 mg by mouth daily with breakfast.    ? telmisartan (MICARDIS) 20 MG tablet TAKE 1 TABLET BY MOUTH EVERY DAY 90 tablet 1  ? zinc gluconate 50 MG tablet Take 50 mg by mouth daily.    ? empagliflozin (JARDIANCE) 25 MG TABS tablet Take 1 tablet (25 mg total) by mouth daily before breakfast. 90 tablet 1  ? JARDIANCE 10 MG TABS tablet TAKE 1 TABLET BY MOUTH DAILY BEFORE BREAKFAST. 90 tablet 1  ? Semaglutide, 1 MG/DOSE, (OZEMPIC, 1 MG/DOSE,) 4 MG/3ML SOPN Inject 1 mg into the skin once a week. 3 mL 2  ? ?No facility-administered medications prior to visit.  ? ? ?Review of Systems; ? ?Patient denies headache, fevers, malaise, unintentional weight loss, skin rash, eye pain, sinus congestion and sinus pain, sore throat, dysphagia,  hemoptysis ,  cough, dyspnea, wheezing, chest pain, palpitations, orthopnea, edema, abdominal pain, nausea, melena, diarrhea, constipation, flank pain, dysuria, hematuria, urinary  Frequency, nocturia, numbness, tingling, seizures,  Focal weakness, Loss of consciousness,  Tremor, insomnia, depression, anxiety, and suicidal ideation.   ? ? ? ?Objective:  ?BP 122/64 (BP Location: Left Arm, Patient Position: Sitting, Cuff Size: Normal)   Pulse 82   Temp 97.6 ?F (36.4 ?C) (Oral)   Ht '5\' 8"'$  (1.727 m)   Wt 142 lb 3.2 oz (64.5 kg)   SpO2 97%   BMI 21.62 kg/m?  ? ?BP Readings from Last 3 Encounters:  ?11/23/21 122/64  ?08/12/21 126/66  ?07/08/21 138/82  ? ? ?Wt Readings from  Last 3 Encounters:  ?11/23/21 142 lb 3.2 oz (64.5 kg)  ?08/12/21 145 lb 12.8 oz (66.1 kg)  ?04/09/21 143 lb 12.8 oz (65.2 kg)  ? ? ?General appearance: alert, cooperative and appears stated age ?Ears: normal TM's and external ear canals both ears ?Throat: lips, mucosa, and tongue normal; teeth and gums normal ?Neck: no adenopathy, no carotid bruit, supple, symmetrical, trachea midline and thyroid not enlarged, symmetric, no tenderness/mass/nodules ?Back: symmetric, no curvature. ROM normal. No CVA tenderness. ?Lungs: clear to auscultation bilaterally ?Heart: regular rate and rhythm, S1, S2 normal, no murmur, click, rub or gallop ?Abdomen: soft, non-tender; bowel sounds normal; no masses,  no organomegaly ?Pulses: 2+ and symmetric ?Skin: Skin color, texture, turgor normal. No rashes or lesions ?Lymph nodes: Cervical, supraclavicular, and axillary nodes normal. ? ?Lab Results  ?Component Value Date  ? HGBA1C 7.1 (H) 11/20/2021  ? HGBA1C 9.2 (H) 08/13/2021  ? HGBA1C 9.4 (H) 08/10/2021  ? ? ?Lab Results  ?Component Value Date  ? CREATININE 1.05 11/20/2021  ? CREATININE 1.05 08/13/2021  ? CREATININE 1.04 04/30/2021  ? ? ?Lab Results  ?Component Value Date  ? WBC 5.5 05/11/2019  ? HGB 16.3 05/11/2019  ? HCT 49.3 05/11/2019  ? PLT 169.0 05/11/2019  ? GLUCOSE 126 (H) 11/20/2021  ? CHOL 131 11/20/2021  ? TRIG 54.0 11/20/2021  ? HDL 41.80 11/20/2021  ? LDLDIRECT 82.0 11/20/2021  ? [place] 78 11/20/2021  ? ALT 41 11/20/2021  ? AST 23 11/20/2021  ? NA 140 11/20/2021  ? K 3.6 11/20/2021  ? CL 105 11/20/2021  ? CREATININE 1.05 11/20/2021  ? BUN 15 11/20/2021  ? CO2 29 11/20/2021  ? TSH 0.84 09/21/2017  ? PSA 1.95 04/09/2021  ? HGBA1C 7.1 (H) 11/20/2021  ? MICROALBUR 0.8 04/09/2021  ? ? ?DG Chest 2 View ? ?Result Date: 12/28/2019 ?CLINICAL DATA:  sob with cough 12/21/19 with fever, Productive cough, Dyspnea on exertion. Hx diabetic. Nonsmoker EXAM: CHEST - 2 VIEW COMPARISON:  Chest radiograph 06/30/2010 FINDINGS: The heart size and  mediastinal contours are within normal limits. The lungs are clear. No pneumothorax or pleural effusion. The visualized skeletal structures are unremarkable. IMPRESSION: No evidence of active disease. Electronically Signed   By: Audie Pinto M.D.   On: 12/28/2019 17:12  ? ? ?Assessment & Plan:  ? ?Problem List Items Addressed This Visit   ? ? Essential hypertension - Primary  ? Relevant Orders  ? Comprehensive metabolic panel  ? Microalbumin / creatinine urine ratio  ? Type 2 diabetes mellitus with hyperglycemia (HCC)  ? Relevant Medications  ? empagliflozin (JARDIANCE) 25 MG TABS tablet  ? Semaglutide, 1 MG/DOSE, (OZEMPIC, 1 MG/DOSE,) 4 MG/3ML SOPN  ? Other Relevant Orders  ? Comprehensive metabolic panel  ? Hemoglobin A1c  ? Microalbumin / creatinine urine ratio  ?  Diabetes mellitus with retinopathy ([place])  ?  Improved control with ozempic and jardiance, but not at goal.  Continue ozempic  , increase  Jardiance t 25 mg daily   Continue  telmisartan 20 mg daily . Annual Eye exam due.  Diabetic Retinopathy found in 2021 was not seen by specialist in [place] (per patient)  Records needed  ? ?Lab Results  ?Component Value Date  ? HGBA1C 7.1 (H) 11/20/2021  ? ?Lab Results  ?Component Value Date  ? MICROALBUR 0.8 04/09/2021  ? MICROALBUR <0.7 09/14/2018  ? ? ? ? ?  ?  ? Relevant Medications  ? empagliflozin (JARDIANCE) 25 MG TABS tablet  ? Semaglutide, 1 MG/DOSE, (OZEMPIC, 1 MG/DOSE,) 4 MG/3ML SOPN  ? Grief  ?  Patient is dealing with the unexpected loss of 2 siblings who lived in [place] and has adequate coping skills and emotional support .   ? ?  ?  ? Hyperlipidemia due to type 2 diabetes mellitus ([place])  ?  LDL is at goal on  rosuvatatin 10 mg  And LFTs are normal  ? ?Lab Results  ?Component Value Date  ? CHOL 131 11/20/2021  ? HDL 41.80 11/20/2021  ? [place] 78 11/20/2021  ? LDLDIRECT 82.0 11/20/2021  ? TRIG 54.0 11/20/2021  ? CHOLHDL 3 11/20/2021  ? ? ?  ?  ? Relevant Medications  ? empagliflozin (JARDIANCE) 25 MG  TABS tablet  ? Semaglutide, 1 MG/DOSE, (OZEMPIC, 1 MG/DOSE,) 4 MG/3ML SOPN  ? Other Relevant Orders  ? Lipid panel  ? Increased prostate specific antigen (PSA) velocity  ?  Urology evaluation not available today, b

## 2021-11-23 NOTE — Telephone Encounter (Signed)
Refilled during office visit today.

## 2021-11-23 NOTE — Assessment & Plan Note (Addendum)
Improved control with ozempic and jardiance, but not at goal.  Continue ozempic  , increase  Jardiance t 25 mg daily   Continue  telmisartan 20 mg daily . Annual Eye exam due.  Diabetic Retinopathy found in 2021 was not seen by specialist in [place] (per patient)  Records needed  ? ?Lab Results  ?Component Value Date  ? HGBA1C 7.1 (H) 11/20/2021  ? ?Lab Results  ?Component Value Date  ? MICROALBUR 0.8 04/09/2021  ? MICROALBUR <0.7 09/14/2018  ? ? ? ? ?

## 2021-11-23 NOTE — Assessment & Plan Note (Signed)
Patient is dealing with the unexpected loss of 2 siblings who lived in [place] and has adequate coping skills and emotional support .   ?

## 2021-11-23 NOTE — Assessment & Plan Note (Signed)
LDL is at goal on  rosuvatatin 10 mg  And LFTs are normal  ? ?Lab Results  ?Component Value Date  ? CHOL 131 11/20/2021  ? HDL 41.80 11/20/2021  ? [place] 78 11/20/2021  ? LDLDIRECT 82.0 11/20/2021  ? TRIG 54.0 11/20/2021  ? CHOLHDL 3 11/20/2021  ? ? ?

## 2021-11-23 NOTE — Patient Instructions (Signed)
Continue Ozempic at current dose ? ?Increase Jardiance to 25 mg daily ? ?Records needed from Urology and retina specialist ? ? ? ?Return in 4 months  ?

## 2021-11-23 NOTE — Telephone Encounter (Signed)
Duplicate message. 

## 2021-11-23 NOTE — Assessment & Plan Note (Signed)
Urology evaluation not available today, but repeat PSA is needed per patient.  Records from [facility]?) requested  ?

## 2021-11-23 NOTE — Addendum Note (Signed)
Addended by: Neta Ehlers on: 11/23/2021 10:11 AM ? ? Modules accepted: Orders ? ?

## 2021-11-24 LAB — MICROALBUMIN / CREATININE URINE RATIO
Creatinine,U: 80.2 mg/dL
Microalb Creat Ratio: 0.9 mg/g (ref 0.0–30.0)
Microalb, Ur: <0.7 mg/dL (ref 0.0–1.9)

## 2021-11-24 LAB — HM DIABETES EYE EXAM

## 2021-11-24 NOTE — Telephone Encounter (Signed)
LMTCB

## 2021-11-25 NOTE — Telephone Encounter (Signed)
See telephone encounter.

## 2021-11-27 NOTE — Telephone Encounter (Signed)
LMTCB

## 2021-11-30 ENCOUNTER — Telehealth: Payer: Self-pay

## 2021-11-30 NOTE — Telephone Encounter (Signed)
CALLED PATIENT NO ANSWER LEFT VOICEMAIL FOR A CALL BACK LETTER SENT 

## 2021-11-30 NOTE — Telephone Encounter (Signed)
Pt returning call

## 2021-12-02 ENCOUNTER — Other Ambulatory Visit: Payer: Self-pay

## 2021-12-02 DIAGNOSIS — Z86010 Personal history of colonic polyps: Secondary | ICD-10-CM

## 2021-12-02 MED ORDER — NA SULFATE-K SULFATE-MG SULF 17.5-3.13-1.6 GM/177ML PO SOLN: 1.0000 | Freq: Once | ORAL | 0 refills | Status: AC

## 2021-12-02 NOTE — Progress Notes (Signed)
Gastroenterology Pre-Procedure Review  Request Date: 12/09/2021 Requesting Physician: Dr. Vicente Males   PATIENT REVIEW QUESTIONS: The patient responded to the following health history questions as indicated:    1. Are you having any GI issues? no 2. Do you have a personal history of Polyps? yes (last colonoscopy) 3. Do you have a family history of Colon Cancer or Polyps? no 4. Diabetes Mellitus? yes (type 2) 5. Joint replacements in the past 12 months?no 6. Major health problems in the past 3 months?no 7. Any artificial heart valves, MVP, or defibrillator?no    MEDICATIONS & ALLERGIES:    Patient reports the following regarding taking any anticoagulation/antiplatelet therapy:   Plavix, Coumadin, Eliquis, Xarelto, Lovenox, Pradaxa, Brilinta, or Effient? no Aspirin? no  Patient confirms/reports the following medications:  Current Outpatient Medications  Medication Sig Dispense Refill   Cetirizine HCl (ZYRTEC ALLERGY) 10 MG CAPS Take 1 capsule (10 mg total) by mouth daily. 90 capsule 3   cholecalciferol (VITAMIN D3) 25 MCG (1000 UNIT) tablet Take 1,000 Units by mouth daily.     empagliflozin (JARDIANCE) 25 MG TABS tablet Take 1 tablet (25 mg total) by mouth daily before breakfast. 90 tablet 1   glucose blood (CONTOUR NEXT TEST) test strip Use to check blood sugar twice daily 100 each 12   rosuvastatin (CRESTOR) 10 MG tablet TAKE 1 TABLET BY MOUTH EVERY DAY 90 tablet 1   Semaglutide, 1 MG/DOSE, (OZEMPIC, 1 MG/DOSE,) 4 MG/3ML SOPN Inject 1 mg into the skin once a week. 9 mL 2   silodosin (RAPAFLO) 8 MG CAPS capsule Take 8 mg by mouth daily with breakfast.     telmisartan (MICARDIS) 20 MG tablet TAKE 1 TABLET BY MOUTH EVERY DAY 90 tablet 1   zinc gluconate 50 MG tablet Take 50 mg by mouth daily.     No current facility-administered medications for this visit.    Patient confirms/reports the following allergies:  Allergies  Allergen Reactions   Amlodipine Rash   Eggs Or Egg-Derived  Products Anaphylaxis   Atorvastatin Nausea Only and Rash   Lisinopril Rash   Metformin And Related Rash   Omeprazole Rash    No orders of the defined types were placed in this encounter.   AUTHORIZATION INFORMATION Primary Insurance: 1D#: Group #:  Secondary Insurance: 1D#: Group #:  SCHEDULE INFORMATION: Date: 12/09/2021 Time: Location:[facility]

## 2021-12-03 MED ORDER — OZEMPIC (1 MG/DOSE) 4 MG/3ML ~~LOC~~ SOPN: 1.0000 mg | PEN_INJECTOR | SUBCUTANEOUS | 2 refills | Status: DC

## 2021-12-03 NOTE — Telephone Encounter (Signed)
See my chart message

## 2021-12-03 NOTE — Telephone Encounter (Signed)
Spoke with pt to try to explain to him that his ozempic pen has multiple doses in it and he stated that it is not that hard to use. He also stated that he wants to change pharmacies because he is not going to argue with them again about the ozempic. Pt stated that he knows how to use the pen it just that the pharmacy only gave him one pen and stated that they can not refill until 12/10/2021. Pt requested to have his medication sent to [facility] on [address] Ozempic has been sent to the new pharmacy.

## 2021-12-09 ENCOUNTER — Ambulatory Visit: Payer: BC Managed Care – PPO | Admitting: Certified Registered Nurse Anesthetist

## 2021-12-09 ENCOUNTER — Encounter
Admission: RE | Disposition: A | Discharge status: Home / Self Care | Payer: Self-pay | Source: Home / Self Care | Attending: Gastroenterology

## 2021-12-09 ENCOUNTER — Ambulatory Visit
Admission: RE | Admit: 2021-12-09 | Discharge: 2021-12-09 | Disposition: A | Discharge status: Home / Self Care | Payer: BC Managed Care – PPO | Attending: Gastroenterology | Admitting: Gastroenterology

## 2021-12-09 DIAGNOSIS — K635 Polyp of colon: Secondary | ICD-10-CM | POA: Diagnosis not present

## 2021-12-09 DIAGNOSIS — E119 Type 2 diabetes mellitus without complications: Secondary | ICD-10-CM | POA: Insufficient documentation

## 2021-12-09 DIAGNOSIS — Z1211 Encounter for screening for malignant neoplasm of colon: Secondary | ICD-10-CM | POA: Diagnosis not present

## 2021-12-09 DIAGNOSIS — I10 Essential (primary) hypertension: Secondary | ICD-10-CM | POA: Diagnosis not present

## 2021-12-09 DIAGNOSIS — D124 Benign neoplasm of descending colon: Secondary | ICD-10-CM | POA: Insufficient documentation

## 2021-12-09 DIAGNOSIS — E785 Hyperlipidemia, unspecified: Secondary | ICD-10-CM | POA: Diagnosis not present

## 2021-12-09 DIAGNOSIS — D120 Benign neoplasm of cecum: Secondary | ICD-10-CM | POA: Diagnosis not present

## 2021-12-09 DIAGNOSIS — Z7984 Long term (current) use of oral hypoglycemic drugs: Secondary | ICD-10-CM | POA: Diagnosis not present

## 2021-12-09 DIAGNOSIS — Z86010 Personal history of colonic polyps: Secondary | ICD-10-CM

## 2021-12-09 HISTORY — PX: COLONOSCOPY WITH PROPOFOL: SHX5780

## 2021-12-09 LAB — GLUCOSE, CAPILLARY: Glucose-Capillary: 105 mg/dL — ABNORMAL HIGH (ref 70–99)

## 2021-12-09 SURGERY — COLONOSCOPY WITH PROPOFOL
Anesthesia: General

## 2021-12-09 MED ORDER — LIDOCAINE HCL (PF) 2 % IJ SOLN
INTRAMUSCULAR | Status: AC
  Filled 2021-12-09: qty 5

## 2021-12-09 MED ORDER — STERILE WATER FOR IRRIGATION IR SOLN
Status: DC | PRN
  Administered 2021-12-09 (×2): 60 mL

## 2021-12-09 MED ORDER — PROPOFOL 500 MG/50ML IV EMUL
INTRAVENOUS | Status: AC
  Filled 2021-12-09: qty 50

## 2021-12-09 MED ORDER — PROPOFOL 10 MG/ML IV BOLUS
INTRAVENOUS | Status: DC | PRN
  Administered 2021-12-09: 60 mg via INTRAVENOUS

## 2021-12-09 MED ORDER — PROPOFOL 500 MG/50ML IV EMUL
INTRAVENOUS | Status: DC | PRN
  Administered 2021-12-09: 150 ug/kg/min via INTRAVENOUS

## 2021-12-09 MED ORDER — SODIUM CHLORIDE 0.9 % IV SOLN
INTRAVENOUS | Status: DC
  Administered 2021-12-09: 20 mL/h via INTRAVENOUS

## 2021-12-09 MED ORDER — LIDOCAINE HCL (CARDIAC) PF 100 MG/5ML IV SOSY
PREFILLED_SYRINGE | INTRAVENOUS | Status: DC | PRN
  Administered 2021-12-09: 50 mg via INTRAVENOUS

## 2021-12-09 NOTE — Op Note (Signed)
[facility] Gastroenterology Patient Name: George Gordon Procedure Date: 12/09/2021 8:08 AM MRN: [id number] Account #: [id number] Date of Birth: July 05, 1961 Admit Type: Outpatient Age: 61 Room: [facility] ENDO ROOM 3 Gender: Male Note Status: Finalized Instrument Name: Park Meo [id number] Procedure:             Colonoscopy Indications:           Screening for colorectal malignant neoplasm Providers:             Jonathon Bellows MD, MD Medicines:             Monitored Anesthesia Care Complications:         No immediate complications. Procedure:             Pre-Anesthesia Assessment:                        - Prior to the procedure, a History and Physical was                         performed, and patient medications, allergies and                         sensitivities were reviewed. The patient's tolerance                         of previous anesthesia was reviewed.                        - The risks and benefits of the procedure and the                         sedation options and risks were discussed with the                         patient. All questions were answered and informed                         consent was obtained.                        - Prior to the procedure, a History and Physical was                         performed, and patient medications, allergies and                         sensitivities were reviewed. The patient's tolerance                         of previous anesthesia was reviewed.                        - The risks and benefits of the procedure and the                         sedation options and risks were discussed with the                         patient. All questions were answered and informed  consent was obtained.                        - ASA Grade Assessment: II - A patient with mild                         systemic disease.                        After obtaining informed consent, the colonoscope was                          passed under direct vision. Throughout the procedure,                         the patient's blood pressure, pulse, and oxygen                         saturations were monitored continuously. The                         Colonoscope was introduced through the anus and                         advanced to the the cecum, identified by the                         appendiceal orifice. The colonoscopy was performed                         with ease. The patient tolerated the procedure well.                         The quality of the bowel preparation was good. Findings:      The perianal and digital rectal examinations were normal.      Two sessile polyps were found in the cecum. The polyps were 5 to 6 mm in       size. These polyps were removed with a cold snare. Resection and       retrieval were complete.      A 6 mm polyp was found in the descending colon. The polyp was sessile.       The polyp was removed with a cold snare. Resection and retrieval were       complete.      The exam was otherwise without abnormality on direct and retroflexion       views. Impression:            - Two 5 to 6 mm polyps in the cecum, removed with a                         cold snare. Resected and retrieved.                        - One 6 mm polyp in the descending colon, removed with                         a cold snare. Resected and retrieved.                        -  The examination was otherwise normal on direct and                         retroflexion views. Recommendation:        - Discharge patient to home (with escort).                        - Resume previous diet.                        - Continue present medications.                        - Await pathology results.                        - Repeat colonoscopy for surveillance based on                         pathology results. Procedure Code(s):     --- Professional ---                        [phone], Colonoscopy, flexible; with removal of                          tumor(s), polyp(s), or other lesion(s) by snare                         technique Diagnosis Code(s):     --- Professional ---                        K63.5, Polyp of colon                        Z12.11, Encounter for screening for malignant neoplasm                         of colon CPT copyright 2019 American Medical Association. All rights reserved. The codes documented in this report are preliminary and upon coder review may  be revised to meet current compliance requirements. Jonathon Bellows, MD Jonathon Bellows MD, MD 12/09/2021 8:37:10 AM This report has been signed electronically. Number of Addenda: 0 Note Initiated On: 12/09/2021 8:08 AM Scope Withdrawal Time: 0 hours 10 minutes 12 seconds  Total Procedure Duration: 0 hours 13 minutes 41 seconds  Estimated Blood Loss:  Estimated blood loss: none.      [facility]

## 2021-12-09 NOTE — Anesthesia Preprocedure Evaluation (Signed)
Anesthesia Evaluation  Patient identified by MRN, date of birth, ID band Patient awake    Reviewed: Allergy & Precautions, NPO status , Patient's Chart, lab work & pertinent test results  History of Anesthesia Complications Negative for: history of anesthetic complications  Airway Mallampati: II  TM Distance: >3 FB Neck ROM: Full    Dental  (+) Partial Upper   Pulmonary neg pulmonary ROS, neg sleep apnea, neg COPD, Patient abstained from smoking.Not current smoker,    Pulmonary exam normal breath sounds clear to auscultation       Cardiovascular Exercise Tolerance: Good METShypertension, Pt. on medications (-) CAD and (-) Past MI (-) dysrhythmias  Rhythm:Regular Rate:Normal - Systolic murmurs    Neuro/Psych negative neurological ROS  negative psych ROS   GI/Hepatic neg GERD  ,(+)     (-) substance abuse  ,   Endo/Other  diabetes, Oral Hypoglycemic Agents  Renal/GU negative Renal ROS     Musculoskeletal   Abdominal   Peds  Hematology   Anesthesia Other Findings Past Medical History: No date: Diabetes mellitus without complication (HCC) No date: Hyperlipidemia No date: Hypertension  Reproductive/Obstetrics                             Anesthesia Physical Anesthesia Plan  ASA: 2  Anesthesia Plan: General   Post-op Pain Management: Minimal or no pain anticipated   Induction: Intravenous  PONV Risk Score and Plan: 2 and Propofol infusion, TIVA and Ondansetron  Airway Management Planned: Nasal Cannula  Additional Equipment: None  Intra-op Plan:   Post-operative Plan:   Informed Consent: I have reviewed the patients History and Physical, chart, labs and discussed the procedure including the risks, benefits and alternatives for the proposed anesthesia with the patient or authorized representative who has indicated his/her understanding and acceptance.     Dental advisory  given  Plan Discussed with: CRNA and Surgeon  Anesthesia Plan Comments: (Discussed risks of anesthesia with patient, including possibility of difficulty with spontaneous ventilation under anesthesia necessitating airway intervention, PONV, and rare risks such as cardiac or respiratory or neurological events, and allergic reactions. Discussed the role of CRNA in patient's perioperative care. Patient understands.)        Anesthesia Quick Evaluation

## 2021-12-09 NOTE — Anesthesia Procedure Notes (Signed)
Procedure Name: MAC Date/Time: 12/09/2021 8:15 AM Performed by: Tollie Eth, CRNA Pre-anesthesia Checklist: Patient identified, Emergency Drugs available, Suction available and Patient being monitored Patient Re-evaluated:Patient Re-evaluated prior to induction Oxygen Delivery Method: Nasal cannula Induction Type: IV induction Placement Confirmation: positive ETCO2

## 2021-12-09 NOTE — Transfer of Care (Signed)
Immediate Anesthesia Transfer of Care Note  Patient: George Gordon  Procedure(s) Performed: COLONOSCOPY WITH PROPOFOL  Patient Location: Endoscopy Unit  Anesthesia Type:General  Level of Consciousness: drowsy  Airway & Oxygen Therapy: Patient Spontanous Breathing  Post-op Assessment: Report given to RN and Post -op Vital signs reviewed and stable  Post vital signs: Reviewed and stable  Last Vitals:  Vitals Value Taken Time  BP 129/76 12/09/21 0837  Temp    Pulse 68 12/09/21 0837  Resp 14 12/09/21 0837  SpO2 100 % 12/09/21 0837  Vitals shown include unvalidated device data.  Last Pain:  Vitals:   12/09/21 0837  TempSrc:   PainSc: Asleep         Complications: No notable events documented.

## 2021-12-09 NOTE — H&P (Signed)
Jonathon Bellows, MD [address], [place], [place], [place], [address] [address], [place], [place], [place], [address] Phone: [phone]  Fax: [phone]  Primary Care Physician:  Crecencio Mc, MD   Pre-Procedure History & Physical: HPI:  George Gordon is a 61 y.o. male is here for an colonoscopy.   Past Medical History:  Diagnosis Date   Diabetes mellitus without complication ([place])    Hyperlipidemia    Hypertension     Past Surgical History:  Procedure Laterality Date   CATARACT EXTRACTION W/PHACO Right 12/21/2016   Procedure: CATARACT EXTRACTION PHACO AND INTRAOCULAR LENS PLACEMENT (IOC);  Surgeon: Birder Robson, MD;  Location: [facility] ORS;  Service: Ophthalmology;  Laterality: Right;  [place] 00:27 AP% 11.3 CDE 3.08 fluid pack lot # [id number]   CATARACT EXTRACTION W/PHACO Left 01/11/2017   Procedure: CATARACT EXTRACTION PHACO AND INTRAOCULAR LENS PLACEMENT (IOC);  Surgeon: Birder Robson, MD;  Location: [facility];  Service: Ophthalmology;  Laterality: Left;  [place]  00:30 AP% 13.0 CDE 3.93 Fluid pack lot # [id number]   COLONOSCOPY     lasik     NO PAST SURGERIES      Prior to Admission medications   Medication Sig Start Date End Date Taking? Authorizing Provider  Cetirizine HCl (ZYRTEC ALLERGY) 10 MG CAPS Take 1 capsule (10 mg total) by mouth daily. 04/09/19  Yes Crecencio Mc, MD  cholecalciferol (VITAMIN D3) 25 MCG (1000 UNIT) tablet Take 1,000 Units by mouth daily.   Yes [provider]  empagliflozin (JARDIANCE) 25 MG TABS tablet Take 1 tablet (25 mg total) by mouth daily before breakfast. 11/23/21  Yes Crecencio Mc, MD  glucose blood (CONTOUR NEXT TEST) test strip Use to check blood sugar twice daily 05/13/21  Yes Crecencio Mc, MD  rosuvastatin (CRESTOR) 10 MG tablet TAKE 1 TABLET BY MOUTH EVERY DAY 10/03/21  Yes Dutch Quint B, FNP  Semaglutide, 1 MG/DOSE, (OZEMPIC, 1 MG/DOSE,) 4 MG/3ML SOPN Inject 1 mg into the skin once a week. 12/03/21  Yes  Crecencio Mc, MD  silodosin (RAPAFLO) 8 MG CAPS capsule Take 8 mg by mouth daily with breakfast.   Yes [provider]  telmisartan (MICARDIS) 20 MG tablet TAKE 1 TABLET BY MOUTH EVERY DAY 10/03/21  Yes Dutch Quint B, FNP  zinc gluconate 50 MG tablet Take 50 mg by mouth daily.   Yes [provider]    Allergies as of 12/02/2021 - Review Complete 12/02/2021  Allergen Reaction Noted   Amlodipine Rash 08/05/2016   Eggs or egg-derived products Anaphylaxis 08/16/2014   Atorvastatin Nausea Only and Rash 08/30/2015   Lisinopril Rash 06/02/2016   Metformin and related Rash 03/30/2018   Omeprazole Rash 12/15/2016    Family History  Problem Relation Age of Onset   Cancer Sister 27       liver cancer   Diabetes Brother        coma  diabetic    Social History   Socioeconomic History   Marital status: Married    Spouse name: Not on file   Number of children: Not on file   Years of education: Not on file   Highest education level: Not on file  Occupational History   Not on file  Tobacco Use   Smoking status: Never   Smokeless tobacco: Never  Vaping Use   Vaping Use: Never used  Substance and Sexual Activity   Alcohol use: Yes    Alcohol/week: 0.0 standard drinks  Comment: Rarely   Drug use: No   Sexual activity: Yes  Other Topics Concern   Not on file  Social History Narrative   Not on file   Social Determinants of Health   Financial Resource Strain: Low Risk    Difficulty of Paying Living Expenses: Not hard at all  Food Insecurity: Not on file  Transportation Needs: Not on file  Physical Activity: Not on file  Stress: Not on file  Social Connections: Not on file  Intimate Partner Violence: Not on file    Review of Systems: See HPI, otherwise negative ROS  Physical Exam: BP (!) 141/83   Pulse 67   Temp (!) 96.7 F (35.9 C) (Temporal)   Resp 20   Ht '5\' 8"'$  (1.727 m)   Wt 62.6 kg   SpO2 100%   BMI 20.98 kg/m  General:   Alert,   pleasant and cooperative in NAD Head:  Normocephalic and atraumatic. Neck:  Supple; no masses or thyromegaly. Lungs:  Clear throughout to auscultation, normal respiratory effort.    Heart:  +S1, +S2, Regular rate and rhythm, No edema. Abdomen:  Soft, nontender and nondistended. Normal bowel sounds, without guarding, and without rebound.   Neurologic:  Alert and  oriented x4;  grossly normal neurologically.  Impression/Plan: George Gordon is here for an colonoscopy to be performed for Screening colonoscopy average risk   Risks, benefits, limitations, and alternatives regarding  colonoscopy have been reviewed with the patient.  Questions have been answered.  All parties agreeable.   Jonathon Bellows, MD  12/09/2021, 8:08 AM

## 2021-12-09 NOTE — Anesthesia Postprocedure Evaluation (Signed)
Anesthesia Post Note  Patient: George Gordon  Procedure(s) Performed: COLONOSCOPY WITH PROPOFOL  Patient location during evaluation: Endoscopy Anesthesia Type: General Level of consciousness: awake and alert Pain management: pain level controlled Vital Signs Assessment: post-procedure vital signs reviewed and stable Respiratory status: spontaneous breathing, nonlabored ventilation, respiratory function stable and patient connected to nasal cannula oxygen Cardiovascular status: blood pressure returned to baseline and stable Postop Assessment: no apparent nausea or vomiting Anesthetic complications: no   No notable events documented.   Last Vitals:  Vitals:   12/09/21 0847 12/09/21 0857  BP: 138/86 (!) 145/82  Pulse: (!) 59 (!) 58  Resp: 13 (!) 21  Temp:    SpO2: 100% 100%    Last Pain:  Vitals:   12/09/21 0857  TempSrc:   PainSc: 0-No pain                 Arita Miss

## 2021-12-10 ENCOUNTER — Encounter: Payer: Self-pay | Admitting: Gastroenterology

## 2021-12-10 LAB — SURGICAL PATHOLOGY

## 2021-12-13 ENCOUNTER — Encounter: Payer: Self-pay | Admitting: Gastroenterology

## 2022-03-22 ENCOUNTER — Other Ambulatory Visit (INDEPENDENT_AMBULATORY_CARE_PROVIDER_SITE_OTHER): Payer: BC Managed Care – PPO

## 2022-03-22 DIAGNOSIS — I10 Essential (primary) hypertension: Secondary | ICD-10-CM | POA: Diagnosis not present

## 2022-03-22 DIAGNOSIS — E1165 Type 2 diabetes mellitus with hyperglycemia: Secondary | ICD-10-CM

## 2022-03-22 DIAGNOSIS — E785 Hyperlipidemia, unspecified: Secondary | ICD-10-CM | POA: Diagnosis not present

## 2022-03-22 DIAGNOSIS — E1169 Type 2 diabetes mellitus with other specified complication: Secondary | ICD-10-CM

## 2022-03-22 LAB — COMPREHENSIVE METABOLIC PANEL
ALT: 29 U/L (ref 0–53)
AST: 20 U/L (ref 0–37)
Albumin: 3.9 g/dL (ref 3.5–5.2)
Alkaline Phosphatase: 60 U/L (ref 39–117)
BUN: 14 mg/dL (ref 6–23)
CO2: 29 mEq/L (ref 19–32)
Calcium: 9.1 mg/dL (ref 8.4–10.5)
Chloride: 105 mEq/L (ref 96–112)
Creatinine, Ser: 1.12 mg/dL (ref 0.40–1.50)
GFR: 70.16 mL/min (ref >60.00)
Glucose, Bld: 139 mg/dL — ABNORMAL HIGH (ref 70–99)
Potassium: 3.6 mEq/L (ref 3.5–5.1)
Sodium: 140 mEq/L (ref 135–145)
Total Bilirubin: 0.9 mg/dL (ref 0.2–1.2)
Total Protein: 6.8 g/dL (ref 6.0–8.3)

## 2022-03-22 LAB — LIPID PANEL
Cholesterol: 131 mg/dL (ref 0–200)
HDL: 42.1 mg/dL (ref >39.00)
LDL Cholesterol: 77 mg/dL (ref 0–99)
NonHDL: 88.43
Total CHOL/HDL Ratio: 3
Triglycerides: 55 mg/dL (ref 0.0–149.0)
VLDL: 11 mg/dL (ref 0.0–40.0)

## 2022-03-22 LAB — HEMOGLOBIN A1C: Hgb A1c MFr Bld: 7.3 % — ABNORMAL HIGH (ref 4.6–6.5)

## 2022-03-26 ENCOUNTER — Ambulatory Visit: Payer: BC Managed Care – PPO | Admitting: Internal Medicine

## 2022-04-27 ENCOUNTER — Encounter: Payer: Self-pay | Admitting: Internal Medicine

## 2022-04-27 ENCOUNTER — Telehealth (INDEPENDENT_AMBULATORY_CARE_PROVIDER_SITE_OTHER): Payer: BC Managed Care – PPO | Admitting: Internal Medicine

## 2022-04-27 DIAGNOSIS — E11319 Type 2 diabetes mellitus with unspecified diabetic retinopathy without macular edema: Secondary | ICD-10-CM

## 2022-04-27 DIAGNOSIS — J069 Acute upper respiratory infection, unspecified: Secondary | ICD-10-CM | POA: Diagnosis not present

## 2022-04-27 NOTE — Assessment & Plan Note (Addendum)
Continue Ozempic., which he has resumed at the 1 mg dose without nausea.  Cautioned to monitor weight , as medication will need to be changed if his BM drops < 20.  Lab Results  Component Value Date   HGBA1C 7.3 (H) 03/22/2022

## 2022-04-27 NOTE — Progress Notes (Signed)
Virtual Visit via [place]   Note    This format is felt to be most appropriate for this patient at this time.  All issues noted in this document were discussed and addressed.  No physical exam was performed (except for noted visual exam findings with Video Visits).   I connected with [name] on 04/27/22 at  4:00 PM EDT by a video enabled telemedicine application  and verified that I am speaking with the correct person using two identifiers. Location patient: home Location provider: work or home office Persons participating in the virtual visit: patient, provider  I discussed the limitations, risks, security and privacy concerns of performing an evaluation and management service by telephone and the availability of in person appointments. I also discussed with the patient that there may be a patient responsible charge related to this service. The patient expressed understanding and agreed to proceed.  Reason for visit: diabetes follow up  HPI:  61 yr old male with type 2 DM converted his scheduled follow up to virtual due to 2 day history of sinus congestion and sneezing.  COVID negative today .  He denies fevers and body aches.  No recent travel     Type 2 DM:  Has started Ozempic oct 12.  And reports that his fasting sugars have been steadily declining.    ROS: See pertinent positives and negatives per HPI.  Past Medical History:  Diagnosis Date   Diabetes mellitus without complication ([place])    Hyperlipidemia    Hypertension     Past Surgical History:  Procedure Laterality Date   CATARACT EXTRACTION W/PHACO Right 12/21/2016   Procedure: CATARACT EXTRACTION PHACO AND INTRAOCULAR LENS PLACEMENT (IOC);  Surgeon: Birder Robson, MD;  Location: [facility];  Service: Ophthalmology;  Laterality: Right;  [place] 00:27 AP% 11.3 CDE 3.08 fluid pack lot # [id number]   CATARACT EXTRACTION W/PHACO Left 01/11/2017   Procedure: CATARACT EXTRACTION PHACO AND INTRAOCULAR LENS PLACEMENT (IOC);   Surgeon: Birder Robson, MD;  Location: [facility];  Service: Ophthalmology;  Laterality: Left;  [place]  00:30 AP% 13.0 CDE 3.93 Fluid pack lot # [id number]   COLONOSCOPY     COLONOSCOPY WITH PROPOFOL N/A 12/09/2021   Procedure: COLONOSCOPY WITH PROPOFOL;  Surgeon: Jonathon Bellows, MD;  [facility];  Service: Gastroenterology;  Laterality: N/A;   lasik     NO PAST SURGERIES      Family History  Problem Relation Age of Onset   Cancer Sister 61       liver cancer   Diabetes Brother        coma  diabetic    SOCIAL HX:  reports that he has never smoked. He has never used smokeless tobacco. He reports current alcohol use. He reports that he does not use drugs.    Current Outpatient Medications:    Cetirizine HCl (ZYRTEC ALLERGY) 10 MG CAPS, Take 1 capsule (10 mg total) by mouth daily., Disp: 90 capsule, Rfl: 3   cholecalciferol (VITAMIN D3) 25 MCG (1000 UNIT) tablet, Take 1,000 Units by mouth daily., Disp: , Rfl:    empagliflozin (JARDIANCE) 25 MG TABS tablet, Take 1 tablet (25 mg total) by mouth daily before breakfast., Disp: 90 tablet, Rfl: 1   glucose blood (CONTOUR NEXT TEST) test strip, Use to check blood sugar twice daily, Disp: 100 each, Rfl: 12   rosuvastatin (CRESTOR) 10 MG tablet, TAKE 1 TABLET BY MOUTH EVERY DAY, Disp: 90 tablet, Rfl: 1   Semaglutide, 1 MG/DOSE, (OZEMPIC, 1 MG/DOSE,) 4  MG/3ML SOPN, Inject 1 mg into the skin once a week., Disp: 9 mL, Rfl: 2   silodosin (RAPAFLO) 8 MG CAPS capsule, Take 8 mg by mouth daily with breakfast., Disp: , Rfl:    telmisartan (MICARDIS) 20 MG tablet, TAKE 1 TABLET BY MOUTH EVERY DAY, Disp: 90 tablet, Rfl: 1   zinc gluconate 50 MG tablet, Take 50 mg by mouth daily., Disp: , Rfl:   EXAM:  VITALS per patient if applicable:  GENERAL: alert, oriented, appears well and in no acute distress  HEENT: atraumatic, conjunttiva clear, no obvious abnormalities on inspection of external nose and ears  NECK: normal movements of the head and  neck  LUNGS: on inspection no signs of respiratory distress, breathing rate appears normal, no obvious gross SOB, gasping or wheezing  CV: no obvious cyanosis  MS: moves all visible extremities without noticeable abnormality  PSYCH/NEURO: pleasant and cooperative, no obvious depression or anxiety, speech and thought processing grossly intact  ASSESSMENT AND PLAN:  Discussed the following assessment and plan:  Type 2 diabetes mellitus with retinopathy without macular edema, without long-term current use of insulin, unspecified laterality, unspecified retinopathy severity ([place])  Viral URI  Diabetes mellitus with retinopathy ([place]) Continue Ozempic., which he has resumed at the 1 mg dose without nausea.  Cautioned to monitor weight , as medication will need to be changed if his BM drops < 20.  Lab Results  Component Value Date   HGBA1C 7.3 (H) 03/22/2022     Viral URI Symptoms are mild and currently testing negative for COVID     I discussed the assessment and treatment plan with the patient. The patient was provided an opportunity to ask questions and all were answered. The patient agreed with the plan and demonstrated an understanding of the instructions.   The patient was advised to call back or seek an in-person evaluation if the symptoms worsen or if the condition fails to improve as anticipated.   I spent 20 minutes dedicated to the care of this patient on the date of this encounter to include pre-visit review of his medical history,  Face-to-face time with the patient , and post visit ordering of testing and therapeutics.    George Mc, MD

## 2022-04-27 NOTE — Assessment & Plan Note (Signed)
Symptoms are mild and currently testing negative for COVID

## 2022-07-01 ENCOUNTER — Encounter: Payer: Self-pay | Admitting: Emergency Medicine

## 2022-07-01 ENCOUNTER — Emergency Department
Admission: EM | Admit: 2022-07-01 | Discharge: 2022-07-02 | Disposition: A | Discharge status: Home / Self Care | Payer: BC Managed Care – PPO | Attending: Emergency Medicine | Admitting: Emergency Medicine

## 2022-07-01 ENCOUNTER — Emergency Department: Payer: BC Managed Care – PPO

## 2022-07-01 ENCOUNTER — Other Ambulatory Visit: Payer: Self-pay

## 2022-07-01 DIAGNOSIS — R200 Anesthesia of skin: Secondary | ICD-10-CM | POA: Diagnosis present

## 2022-07-01 DIAGNOSIS — E119 Type 2 diabetes mellitus without complications: Secondary | ICD-10-CM | POA: Diagnosis not present

## 2022-07-01 DIAGNOSIS — I10 Essential (primary) hypertension: Secondary | ICD-10-CM | POA: Diagnosis not present

## 2022-07-01 DIAGNOSIS — R202 Paresthesia of skin: Secondary | ICD-10-CM | POA: Insufficient documentation

## 2022-07-01 LAB — CBC WITH DIFFERENTIAL/PLATELET
Abs Immature Granulocytes: 0 10*3/uL (ref 0.00–0.07)
Basophils Absolute: 0 10*3/uL (ref 0.0–0.1)
Basophils Relative: 0 %
Eosinophils Absolute: 0 10*3/uL (ref 0.0–0.5)
Eosinophils Relative: 1 %
HCT: 49.5 % (ref 39.0–52.0)
Hemoglobin: 16.1 g/dL (ref 13.0–17.0)
Immature Granulocytes: 0 %
Lymphocytes Relative: 36 %
Lymphs Abs: 1.7 10*3/uL (ref 0.7–4.0)
MCH: 30.2 pg (ref 26.0–34.0)
MCHC: 32.5 g/dL (ref 30.0–36.0)
MCV: 92.9 fL (ref 80.0–100.0)
Monocytes Absolute: 0.4 10*3/uL (ref 0.1–1.0)
Monocytes Relative: 8 %
Neutro Abs: 2.5 10*3/uL (ref 1.7–7.7)
Neutrophils Relative %: 55 %
Platelets: 159 10*3/uL (ref 150–400)
RBC: 5.33 MIL/uL (ref 4.22–5.81)
RDW: 13.2 % (ref 11.5–15.5)
WBC: 4.6 10*3/uL (ref 4.0–10.5)
nRBC: 0 % (ref 0.0–0.2)

## 2022-07-01 LAB — COMPREHENSIVE METABOLIC PANEL
ALT: 58 U/L — ABNORMAL HIGH (ref 0–44)
AST: 31 U/L (ref 15–41)
Albumin: 4 g/dL (ref 3.5–5.0)
Alkaline Phosphatase: 62 U/L (ref 38–126)
Anion gap: 7 (ref 5–15)
BUN: 15 mg/dL (ref 8–23)
CO2: 25 mmol/L (ref 22–32)
Calcium: 9.4 mg/dL (ref 8.9–10.3)
Chloride: 108 mmol/L (ref 98–111)
Creatinine, Ser: 0.92 mg/dL (ref 0.61–1.24)
GFR, Estimated: >60 mL/min (ref >60)
Glucose, Bld: 170 mg/dL — ABNORMAL HIGH (ref 70–99)
Potassium: 3.9 mmol/L (ref 3.5–5.1)
Sodium: 140 mmol/L (ref 135–145)
Total Bilirubin: 1 mg/dL (ref 0.3–1.2)
Total Protein: 7.1 g/dL (ref 6.5–8.1)

## 2022-07-01 NOTE — ED Triage Notes (Signed)
Patient arrives ambulatory by POV c/o intermittent left arm and leg numbness x 4 days. States when standing up having some light headedness. No neuro deficits noted in triage.

## 2022-07-01 NOTE — ED Notes (Signed)
Pt to MRI via wheelchair.

## 2022-07-01 NOTE — ED Provider Notes (Signed)
[facility] Provider Note    Event Date/Time   First MD Initiated Contact with Patient 07/01/22 1826     (approximate)   History   Numbness   HPI  George Gordon is a 61 y.o. male with a history of diabetes, hypertension, and hyperlipidemia who presents with tingling and numbness to his left arm and left leg over the last 5 days, intermittent course and not occurring with any specific activity or position.  He also states that when he stands up he gets lightheaded for a few minutes.  This is also over the last 5 days but does not specifically correlate with the episodes of numbness.  He denies headache, vision changes, difficulty with speech, coordination, walking, or other acute symptoms.  He denies any prior history of the symptoms.  I reviewed the past medical records.  The patient's most recent outpatient encounter was a follow-up visit with his PMD Dr. Brooke Bonito on 10/17 for respiratory symptoms.  He is on Ozempic for type 2 diabetes.   Physical Exam   Triage Vital Signs: ED Triage Vitals  Enc Vitals Group     BP 07/01/22 1452 133/79     Pulse Rate 07/01/22 1452 82     Resp 07/01/22 1452 18     Temp 07/01/22 1452 97.9 F (36.6 C)     Temp Source 07/01/22 1452 Oral     SpO2 07/01/22 1452 98 %     Weight 07/01/22 1453 138 lb (62.6 kg)     Height 07/01/22 1453 '5\' 8"'$  (1.727 m)     Head Circumference --      Peak Flow --      Pain Score 07/01/22 1459 0     Pain Loc --      Pain Edu? --      Excl. in [place]? --     Most recent vital signs: Vitals:   07/01/22 1930 07/01/22 2140  BP: 136/76 134/70  Pulse: 62 67  Resp: 13 16  Temp:  97.9 F (36.6 C)  SpO2: 99% 100%    General: Awake, no distress.  CV:  Good peripheral perfusion.  Resp:  Normal effort.  Abd:  No distention.  Other:  EOMI.  PERRLA.  No facial droop.  Cranial nerves III through XII intact.  Motor and sensory intact in all extremities.  Normal coordination with no ataxia on  finger-to-nose.  No pronator drift.   ED Results / Procedures / Treatments   Labs (all labs ordered are listed, but only abnormal results are displayed) Labs Reviewed  COMPREHENSIVE METABOLIC PANEL - Abnormal; Notable for the following components:      Result Value   Glucose, Bld 170 (*)    ALT 58 (*)    All other components within normal limits  CBC WITH DIFFERENTIAL/PLATELET     EKG  ED ECG REPORT I, Arta Silence, the attending physician, personally viewed and interpreted this ECG.  Date: 07/01/2022 EKG Time: 1517 Rate: 69 Rhythm: normal sinus rhythm QRS Axis: normal Intervals: normal ST/T Wave abnormalities: Nonspecific T wave abnormalities Narrative Interpretation: no evidence of acute ischemia no recent prior EKG available for comparison    RADIOLOGY  CT head: Independently viewed and interpreted the images; there is no ICH.  Radiology report indicates no acute abnormality.   PROCEDURES:  Critical Care performed: No  Procedures   MEDICATIONS ORDERED IN ED: Medications - No data to display   IMPRESSION / MDM / [place] / ED  COURSE  I reviewed the triage vital signs and the nursing notes.  61 year old male with PMH as noted above presents with intermittent left arm and leg paresthesias and numbness along with intermittent orthostatic lightheadedness over the last several days.  His vital signs are normal and the physical exam including thorough neurologic exam is normal.  Differential diagnosis includes, but is not limited to, peripheral neuropathy, cervical radiculopathy, TIA, complex migraine, demyelinating disorder.  CT head is negative.  Lab workup shows no electrolyte abnormality or other metabolic disturbance.  The patient is not anemic and there is no leukocytosis.  We will obtain MRI of the brain and cervical spine for further evaluation; if these are negative anticipate discharge home with outpatient neurology and PMD  follow-up.  Patient's presentation is most consistent with acute presentation with potential threat to life or bodily function.  The patient is on the cardiac monitor to evaluate for evidence of arrhythmia and/or significant heart rate changes.  ----------------------------------------- 11:23 PM on 07/01/2022 -----------------------------------------  MRI is pending. I have signed out the patient to the oncoming ED physician Dr. Tamala Julian.  FINAL CLINICAL IMPRESSION(S) / ED DIAGNOSES   Final diagnoses:  Paresthesias     Rx / DC Orders   ED Discharge Orders     None        Note:  This document was prepared using Dragon voice recognition software and may include unintentional dictation errors.    Arta Silence, MD 07/01/22 2324

## 2022-07-01 NOTE — ED Provider Triage Note (Signed)
  Emergency Medicine Provider Triage Evaluation Note  George Gordon , a 61 y.o.male,  was evaluated in triage.  Pt complains of left-sided numbness x 4 days.  Patient states that he experiences numb/tingling sensation in the left upper extremity and left lower extremity.  He states that he feels the most whenever he gets up and walks.  Reports some dizziness as well.   Review of Systems  Positive: Procedures, dizziness. Negative: Denies fever, chest pain, vomiting  Physical Exam   Vitals:   07/01/22 1452  BP: 133/79  Pulse: 82  Resp: 18  Temp: 97.9 F (36.6 C)  SpO2: 98%   Gen:   Awake, no distress   Resp:  Normal effort  MSK:   Moves extremities without difficulty  Other:  No facial droop, no gross neurological deficits, 5/5 strength in both upper and lower extremities.  Normal sensation all extremities.  Medical Decision Making  Given the patient's initial medical screening exam, the following diagnostic evaluation has been ordered. The patient will be placed in the appropriate treatment space, once one is available, to complete the evaluation and treatment. I have discussed the plan of care with the patient and I have advised the patient that an ED physician or mid-level practitioner will reevaluate their condition after the test results have been received, as the results may give them additional insight into the type of treatment they may need.    Diagnostics: EKG, labs, head CT  Treatments: none immediately   Teodoro Spray, [place] 07/01/22 1520

## 2022-07-01 NOTE — ED Notes (Signed)
MRI performing screening questions for patient.

## 2022-07-01 NOTE — ED Notes (Signed)
Pt provided juice -Ok'd by provider.

## 2022-07-02 ENCOUNTER — Telehealth: Payer: Self-pay | Admitting: Internal Medicine

## 2022-07-02 NOTE — ED Provider Notes (Signed)
Patient received in signout from Dr. Cherylann Banas pending MRI brain and C-spine for evaluation of intermittent left-sided paresthesias to his arms and legs without discrete injuries.  MRIs returned without acute features and we will discharge with neurology referral, per original plan of care.   George Crofts, MD 07/02/22 [phone]

## 2022-07-02 NOTE — ED Notes (Signed)
Pt refused vitals 

## 2022-07-02 NOTE — Telephone Encounter (Signed)
ED follow up scheduled ?

## 2022-07-02 NOTE — Telephone Encounter (Signed)
Patient called and wanted to schedule an ED follow up. Patient went to the ED for intermittent left arm and leg paresthesias and numbness along with intermittent orthostatic lightheadedness over the last several days.

## 2022-07-06 ENCOUNTER — Telehealth: Payer: Self-pay | Admitting: Internal Medicine

## 2022-07-06 NOTE — Telephone Encounter (Signed)
Transition Care Management Unsuccessful Follow-up Telephone Call  Date of discharge and from where:  07/01/22 [facility]  Attempts:  1st Attempt  Reason for unsuccessful TCM follow-up call:  Left voice message

## 2022-07-07 ENCOUNTER — Encounter: Payer: Self-pay | Admitting: Internal Medicine

## 2022-07-07 ENCOUNTER — Ambulatory Visit: Payer: BC Managed Care – PPO | Admitting: Internal Medicine

## 2022-07-07 VITALS — BP 126/70 | HR 84 | Temp 98.1°F | Ht 68.0 in | Wt 138.8 lb

## 2022-07-07 DIAGNOSIS — E11319 Type 2 diabetes mellitus with unspecified diabetic retinopathy without macular edema: Secondary | ICD-10-CM

## 2022-07-07 DIAGNOSIS — G629 Polyneuropathy, unspecified: Secondary | ICD-10-CM | POA: Diagnosis not present

## 2022-07-07 DIAGNOSIS — I6930 Unspecified sequelae of cerebral infarction: Secondary | ICD-10-CM

## 2022-07-07 DIAGNOSIS — E1165 Type 2 diabetes mellitus with hyperglycemia: Secondary | ICD-10-CM

## 2022-07-07 DIAGNOSIS — R202 Paresthesia of skin: Secondary | ICD-10-CM | POA: Diagnosis not present

## 2022-07-07 DIAGNOSIS — R748 Abnormal levels of other serum enzymes: Secondary | ICD-10-CM | POA: Diagnosis not present

## 2022-07-07 MED ORDER — ASPIRIN 81 MG PO TBEC: 81.0000 mg | DELAYED_RELEASE_TABLET | Freq: Every day | ORAL | 12 refills | Status: AC

## 2022-07-07 NOTE — Patient Instructions (Signed)
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

## 2022-07-07 NOTE — Progress Notes (Unsigned)
Subjective:  Patient ID: George Gordon, male    DOB: [birth year]  Age: 61 y.o. MRN: [id number]  CC: There were no encounter diagnoses.   HPI Yasir Kitner presents for  Chief Complaint  Patient presents with   Hospitalization Follow-up    ED follow up    Evaluated in ER for 5 day history of numbness of left leg and left arm. CT head, MRI brain and cervical spine done .  Spent 10 hours in ER , given  no food or drink in 10 hours so he left after he returned from the MRI because  the ER physician went off shift   Has been unable to schedule a visit with Dr Melrose Nakayama without a referral.  Numbness ad tingling persistent,  no pain, feels a loss of balance with sudden position change to standing   Taking  telmisartan 20 mg daily , rapaflo (urology, chronic  for nearly a year ) ) b,  and jardiance since May . Weight has been stable .  Drinks 32 to 48 ounces of water daily    Outpatient Medications Prior to Visit  Medication Sig Dispense Refill   Cetirizine HCl (ZYRTEC ALLERGY) 10 MG CAPS Take 1 capsule (10 mg total) by mouth daily. 90 capsule 3   cholecalciferol (VITAMIN D3) 25 MCG (1000 UNIT) tablet Take 1,000 Units by mouth daily.     empagliflozin (JARDIANCE) 25 MG TABS tablet Take 1 tablet (25 mg total) by mouth daily before breakfast. 90 tablet 1   glucose blood (CONTOUR NEXT TEST) test strip Use to check blood sugar twice daily 100 each 12   rosuvastatin (CRESTOR) 10 MG tablet TAKE 1 TABLET BY MOUTH EVERY DAY 90 tablet 1   Semaglutide, 1 MG/DOSE, (OZEMPIC, 1 MG/DOSE,) 4 MG/3ML SOPN Inject 1 mg into the skin once a week. 9 mL 2   silodosin (RAPAFLO) 8 MG CAPS capsule Take 8 mg by mouth daily with breakfast.     telmisartan (MICARDIS) 20 MG tablet TAKE 1 TABLET BY MOUTH EVERY DAY 90 tablet 1   zinc gluconate 50 MG tablet Take 50 mg by mouth daily.     No facility-administered medications prior to visit.    Review of Systems;  Patient denies headache, fevers, malaise,  unintentional weight loss, skin rash, eye pain, sinus congestion and sinus pain, sore throat, dysphagia,  hemoptysis , cough, dyspnea, wheezing, chest pain, palpitations, orthopnea, edema, abdominal pain, nausea, melena, diarrhea, constipation, flank pain, dysuria, hematuria, urinary  Frequency, nocturia, numbness, tingling, seizures,  Focal weakness, Loss of consciousness,  Tremor, insomnia, depression, anxiety, and suicidal ideation.      Objective:  BP 126/70   Pulse 84   Temp 98.1 F (36.7 C) (Oral)   Ht '5\' 8"'$  (1.727 m)   Wt 138 lb 12.8 oz (63 kg)   SpO2 96%   BMI 21.10 kg/m   BP Readings from Last 3 Encounters:  07/07/22 126/70  07/01/22 134/70  04/27/22 128/89    Wt Readings from Last 3 Encounters:  07/07/22 138 lb 12.8 oz (63 kg)  07/01/22 138 lb (62.6 kg)  04/27/22 138 lb (62.6 kg)    Physical Exam Vitals reviewed.  Constitutional:      General: He is not in acute distress.    Appearance: Normal appearance. He is normal weight. He is not ill-appearing, toxic-appearing or diaphoretic.  HENT:     Head: Normocephalic and atraumatic.     Right Ear: Tympanic membrane, ear canal and external ear normal. There  is no impacted cerumen.     Left Ear: Tympanic membrane, ear canal and external ear normal. There is no impacted cerumen.     Nose: Nose normal.     Mouth/Throat:     Mouth: Mucous membranes are moist.     Pharynx: Oropharynx is clear.  Eyes:     General: No scleral icterus.       Right eye: No discharge.        Left eye: No discharge.     Conjunctiva/sclera: Conjunctivae normal.  Neck:     Thyroid: No thyromegaly.     Vascular: No carotid bruit or JVD.  Cardiovascular:     Rate and Rhythm: Normal rate and regular rhythm.     Heart sounds: Normal heart sounds.  Pulmonary:     Effort: Pulmonary effort is normal. No respiratory distress.     Breath sounds: Normal breath sounds.  Abdominal:     General: Bowel sounds are normal.     Palpations: Abdomen is  soft. There is no mass.     Tenderness: There is no abdominal tenderness. There is no guarding or rebound.  Musculoskeletal:        General: Normal range of motion.     Cervical back: Normal range of motion and neck supple.  Lymphadenopathy:     Cervical: No cervical adenopathy.  Skin:    General: Skin is warm and dry.  Neurological:     General: No focal deficit present.     Mental Status: He is alert and oriented to person, place, and time. Mental status is at baseline.  Psychiatric:        Mood and Affect: Mood normal.        Behavior: Behavior normal.        Thought Content: Thought content normal.        Judgment: Judgment normal.     Lab Results  Component Value Date   HGBA1C 7.3 (H) 03/22/2022   HGBA1C 7.1 (H) 11/20/2021   HGBA1C 9.2 (H) 08/13/2021    Lab Results  Component Value Date   CREATININE 0.92 07/01/2022   CREATININE 1.12 03/22/2022   CREATININE 1.05 11/20/2021    Lab Results  Component Value Date   WBC 4.6 07/01/2022   HGB 16.1 07/01/2022   HCT 49.5 07/01/2022   PLT 159 07/01/2022   GLUCOSE 170 (H) 07/01/2022   CHOL 131 03/22/2022   TRIG 55.0 03/22/2022   HDL 42.10 03/22/2022   LDLDIRECT 82.0 11/20/2021   LDLCALC 77 03/22/2022   ALT 58 (H) 07/01/2022   AST 31 07/01/2022   NA 140 07/01/2022   K 3.9 07/01/2022   CL 108 07/01/2022   CREATININE 0.92 07/01/2022   BUN 15 07/01/2022   CO2 25 07/01/2022   TSH 0.84 09/21/2017   PSA 1.72 11/23/2021   HGBA1C 7.3 (H) 03/22/2022   MICROALBUR <0.7 11/23/2021    MR BRAIN WO CONTRAST  Result Date: 07/02/2022 CLINICAL DATA:  Initial evaluation for neuro deficit, stroke suspected. EXAM: MRI HEAD WITHOUT CONTRAST TECHNIQUE: Multiplanar, multiecho pulse sequences of the brain and surrounding structures were obtained without intravenous contrast. COMPARISON:  CT from earlier the same day. FINDINGS: Brain: Cerebral volume within normal limits. Mild scattered patchy T2/FLAIR hyperintensity involving the  supratentorial cerebral white matter, most likely related to chronic microvascular ischemic disease, mild for age. Few small remote lacunar infarcts present the bilateral basal ganglia and left thalamus. About No evidence for acute or subacute ischemia. Gray-white matter differentiation maintained.  No areas of chronic cortical infarction. No acute intracranial hemorrhage. Single punctate chronic microhemorrhage noted within the pons. Few additional small foci of susceptibility artifact related to parenchymal calcifications and seen on prior head CT. No mass lesion, midline shift or mass effect. No hydrocephalus or extra-axial fluid collection. Pituitary gland and suprasellar region within normal limits. Vascular: Major intracranial vascular flow voids are maintained. Skull and upper cervical spine: Craniocervical junction within normal limits. Bone marrow signal intensity normal. No scalp soft tissue abnormality. Sinuses/Orbits: Prior bilateral ocular lens replacement. Paranasal sinuses are largely clear. No mastoid effusion. Other: None. IMPRESSION: 1. No acute intracranial abnormality. 2. Mild chronic microvascular ischemic disease with a few small remote lacunar infarcts involving the bilateral basal ganglia and left thalamus. Electronically Signed   By: Jeannine Boga M.D.   On: 07/02/2022 00:21   MR Cervical Spine Wo Contrast  Result Date: 07/01/2022 CLINICAL DATA:  Cervical radiculopathy. Intermittent left arm and left leg numbness for 4 days. EXAM: MRI CERVICAL SPINE WITHOUT CONTRAST TECHNIQUE: Multiplanar, multisequence MR imaging of the cervical spine was performed. No intravenous contrast was administered. COMPARISON:  None Available. FINDINGS: Alignment: Physiologic. Vertebrae: No fracture, evidence of discitis, or bone lesion. Cord: Normal signal and morphology. Posterior Fossa, vertebral arteries, paraspinal tissues: Negative. Disc levels: C2-3: No significant disc bulge. No neural foraminal  stenosis. No central canal stenosis. C3-4: No significant disc bulge. No neural foraminal stenosis. No central canal stenosis. C4-5: No significant disc bulge. No neural foraminal stenosis. No central canal stenosis. C5-6: No significant disc bulge. No neural foraminal stenosis. No central canal stenosis. C6-7: No significant disc bulge. No neural foraminal stenosis. No central canal stenosis. C7-T1: No significant disc bulge. No neural foraminal stenosis. No central canal stenosis. IMPRESSION: 1. No significant disc bulge, foraminal stenosis or central canal stenosis of the cervical spine. 2. No acute osseous injury of the cervical spine. Electronically Signed   By: Keane Police D.O.   On: 07/01/2022 23:45   CT Head Wo Contrast  Result Date: 07/01/2022 CLINICAL DATA:  Neuro deficit, persistent/recurrent, CNS neoplasm suspected Left sided weakness/numbness EXAM: CT HEAD WITHOUT CONTRAST TECHNIQUE: Contiguous axial images were obtained from the base of the skull through the vertex without intravenous contrast. RADIATION DOSE REDUCTION: This exam was performed according to the departmental dose-optimization program which includes automated exposure control, adjustment of the mA and/or kV according to patient size and/or use of iterative reconstruction technique. COMPARISON:  CT head 07/02/2016. MRI head 02/04/2019. Also, MRI head August 13, 2016. FINDINGS: Brain: No evidence of acute infarction, hemorrhage, hydrocephalus, extra-axial collection or mass lesion/mass effect. Similar appearance of multiple small foci of calcification in the left frontal and parietal lobes. Vascular: No hyperdense vessel. Skull: No acute fracture. Sinuses/Orbits: Clear sinuses.  No acute orbital findings. Other: No mastoid effusions. IMPRESSION: 1. No evidence of acute intracranial abnormality. 2. Similar appearance of multiple small foci of calcification in the left frontal and parietal lobes, further characterized on prior MRIs.  Electronically Signed   By: Margaretha Sheffield M.D.   On: 07/01/2022 15:50    Assessment & Plan:  .There are no diagnoses linked to this encounter.   I provided 30 minutes of face-to-face time during this encounter reviewing patient's last visit with me, patient's  most recent visit with cardiology,  nephrology,  and neurology,  recent surgical and non surgical procedures, previous  labs and imaging studies, counseling on currently addressed issues,  and post visit ordering to diagnostics and therapeutics .  Follow-up: No follow-ups on file.   Crecencio Mc, MD

## 2022-07-08 ENCOUNTER — Encounter: Payer: Self-pay | Admitting: Internal Medicine

## 2022-07-08 DIAGNOSIS — R202 Paresthesia of skin: Secondary | ICD-10-CM | POA: Insufficient documentation

## 2022-07-08 DIAGNOSIS — G629 Polyneuropathy, unspecified: Secondary | ICD-10-CM | POA: Insufficient documentation

## 2022-07-08 LAB — HEPATIC FUNCTION PANEL
ALT: 51 U/L (ref 0–53)
AST: 29 U/L (ref 0–37)
Albumin: 3.9 g/dL (ref 3.5–5.2)
Alkaline Phosphatase: 63 U/L (ref 39–117)
Bilirubin, Direct: 0.2 mg/dL (ref 0.0–0.3)
Total Bilirubin: 1.1 mg/dL (ref 0.2–1.2)
Total Protein: 6.7 g/dL (ref 6.0–8.3)

## 2022-07-08 LAB — HEMOGLOBIN A1C: Hgb A1c MFr Bld: 7.4 % — ABNORMAL HIGH (ref 4.6–6.5)

## 2022-07-08 LAB — MAGNESIUM: Magnesium: 2 mg/dL (ref 1.5–2.5)

## 2022-07-08 NOTE — Assessment & Plan Note (Signed)
MRI brain and cervical spine reviewed,  no  report of lesions suggestive of MS, and no cervical disk disease.  Referring to neurology for further evaluation

## 2022-07-08 NOTE — Assessment & Plan Note (Signed)
Noted again on most recent MRI.  Has not been taking asa.  Reviewed need for asa and BP control

## 2022-07-08 NOTE — Assessment & Plan Note (Addendum)
Improved control with ozempic and jardiance,  Continue ozempic, tolerating increased dose of   Jardiance,   telmisartan 20 mg daily . Repeat labs are pending   Lab Results  Component Value Date   HGBA1C 7.3 (H) 03/22/2022

## 2022-07-09 LAB — B12 AND FOLATE PANEL
Folate: >23.8 ng/mL (ref >5.9)
Vitamin B-12: 615 pg/mL (ref 211–911)

## 2022-07-09 LAB — TSH: TSH: 0.61 u[IU]/mL (ref 0.35–5.50)

## 2022-07-26 ENCOUNTER — Other Ambulatory Visit: Payer: Self-pay | Admitting: Internal Medicine

## 2022-07-26 DIAGNOSIS — E1165 Type 2 diabetes mellitus with hyperglycemia: Secondary | ICD-10-CM

## 2022-08-12 ENCOUNTER — Other Ambulatory Visit: Payer: Self-pay

## 2022-08-12 DIAGNOSIS — E1169 Type 2 diabetes mellitus with other specified complication: Secondary | ICD-10-CM

## 2022-08-12 MED ORDER — TELMISARTAN 20 MG PO TABS: 20.0000 mg | ORAL_TABLET | Freq: Every day | ORAL | 1 refills | Status: DC

## 2022-08-12 MED ORDER — ROSUVASTATIN CALCIUM 10 MG PO TABS: 10.0000 mg | ORAL_TABLET | Freq: Every day | ORAL | 1 refills | Status: DC

## 2022-09-13 ENCOUNTER — Telehealth: Payer: Self-pay | Admitting: Internal Medicine

## 2022-09-13 ENCOUNTER — Other Ambulatory Visit: Payer: Self-pay

## 2022-09-13 DIAGNOSIS — E1165 Type 2 diabetes mellitus with hyperglycemia: Secondary | ICD-10-CM

## 2022-09-13 MED ORDER — CONTOUR NEXT TEST VI STRP: ORAL_STRIP | 12 refills | Status: DC

## 2022-09-13 NOTE — Telephone Encounter (Signed)
sent 

## 2022-09-13 NOTE — Telephone Encounter (Signed)
Pt need a refill on glucose test strips sent to [facility]

## 2022-12-13 LAB — HM DIABETES EYE EXAM

## 2023-01-10 ENCOUNTER — Telehealth: Payer: Self-pay | Admitting: Internal Medicine

## 2023-01-10 DIAGNOSIS — E1165 Type 2 diabetes mellitus with hyperglycemia: Secondary | ICD-10-CM

## 2023-01-10 MED ORDER — CONTOUR NEXT TEST VI STRP: ORAL_STRIP | 12 refills | Status: DC

## 2023-01-10 NOTE — Telephone Encounter (Signed)
Prescription Request  01/10/2023  LOV: 07/07/2022  What is the name of the medication or equipment? glucose blood (CONTOUR NEXT TEST) test strip  Have you contacted your pharmacy to request a refill? Yes   Which pharmacy would you like this sent to?   [id number] [place], [place] - [address] [place] [address] Phone: [phone] Fax: [phone]    Patient notified that their request is being sent to the clinical staff for review and that they should receive a response within 2 business days.   Please advise at Mobile [phone] (mobile)

## 2023-01-10 NOTE — Telephone Encounter (Signed)
Medication refilled

## 2023-02-06 ENCOUNTER — Other Ambulatory Visit: Payer: Self-pay | Admitting: Internal Medicine

## 2023-02-06 DIAGNOSIS — E1169 Type 2 diabetes mellitus with other specified complication: Secondary | ICD-10-CM

## 2023-02-09 ENCOUNTER — Other Ambulatory Visit: Payer: Self-pay | Admitting: Internal Medicine

## 2023-02-09 DIAGNOSIS — E1165 Type 2 diabetes mellitus with hyperglycemia: Secondary | ICD-10-CM

## 2023-03-03 ENCOUNTER — Encounter: Payer: Self-pay | Admitting: Internal Medicine

## 2023-03-03 ENCOUNTER — Telehealth (INDEPENDENT_AMBULATORY_CARE_PROVIDER_SITE_OTHER): Payer: BC Managed Care – PPO | Admitting: Internal Medicine

## 2023-03-03 DIAGNOSIS — U071 COVID-19: Secondary | ICD-10-CM

## 2023-03-03 MED ORDER — NIRMATRELVIR/RITONAVIR (PAXLOVID)TABLET: 3.0000 | ORAL_TABLET | Freq: Two times a day (BID) | ORAL | 0 refills | Status: AC

## 2023-03-03 NOTE — Progress Notes (Signed)
Subjective:    Patient ID: George Gordon, male    DOB: 1961-06-11, 62 y.o.   MRN: [id number]  HPI Video virtual visit due to COVID infection Identification done Reviewed limitations and billing and he gave consent Participants--patient in his home and I am in my office  Started last night --whole body aching  Then sore throat Fever Did COVID test this morning--was positive Feels stuffy, now coughing more No SOB Some headache  Did take some tylenol  Current Outpatient Medications on File Prior to Visit  Medication Sig Dispense Refill   aspirin EC 81 MG tablet Take 1 tablet (81 mg total) by mouth daily. Swallow whole. 30 tablet 12   Cetirizine HCl (ZYRTEC ALLERGY) 10 MG CAPS Take 1 capsule (10 mg total) by mouth daily. 90 capsule 3   cholecalciferol (VITAMIN D3) 25 MCG (1000 UNIT) tablet Take 1,000 Units by mouth daily.     empagliflozin (JARDIANCE) 25 MG TABS tablet TAKE 1 TABLET BY MOUTH EVERY DAY BEFORE BREAKFAST 90 tablet 0   glucose blood (CONTOUR NEXT TEST) test strip Use to check blood sugar twice daily 100 each 12   rosuvastatin (CRESTOR) 10 MG tablet TAKE 1 TABLET BY MOUTH EVERY DAY 90 tablet 1   Semaglutide, 1 MG/DOSE, (OZEMPIC, 1 MG/DOSE,) 4 MG/3ML SOPN Inject 1 mg into the skin once a week. 9 mL 2   silodosin (RAPAFLO) 8 MG CAPS capsule Take 8 mg by mouth daily with breakfast.     telmisartan (MICARDIS) 20 MG tablet TAKE 1 TABLET BY MOUTH EVERY DAY 90 tablet 1   zinc gluconate 50 MG tablet Take 50 mg by mouth daily.     No current facility-administered medications on file prior to visit.    Allergies  Allergen Reactions   Amlodipine Rash   Atorvastatin Nausea Only and Rash   Lisinopril Rash   Metformin And Related Rash   Omeprazole Rash    Past Medical History:  Diagnosis Date   Diabetes mellitus without complication (HCC)    Facial trauma, sequela 08/07/2016   Hyperlipidemia    Hypertension     Past Surgical History:  Procedure Laterality Date    CATARACT EXTRACTION W/PHACO Right 12/21/2016   Procedure: CATARACT EXTRACTION PHACO AND INTRAOCULAR LENS PLACEMENT (IOC);  Surgeon: Galen Manila, MD;  Location: [facility] ORS;  Service: Ophthalmology;  Laterality: Right;  [place] 00:27 AP% 11.3 CDE 3.08 fluid pack lot # [id number]   CATARACT EXTRACTION W/PHACO Left 01/11/2017   Procedure: CATARACT EXTRACTION PHACO AND INTRAOCULAR LENS PLACEMENT (IOC);  Surgeon: Galen Manila, MD;  Location: [facility] ORS;  Service: Ophthalmology;  Laterality: Left;  [place]  00:30 AP% 13.0 CDE 3.93 Fluid pack lot # [id number]   COLONOSCOPY     COLONOSCOPY WITH PROPOFOL N/A 12/09/2021   Procedure: COLONOSCOPY WITH PROPOFOL;  Surgeon: Wyline Mood, MD;  Location: [facility] ENDOSCOPY;  Service: Gastroenterology;  Laterality: N/A;   lasik     NO PAST SURGERIES      Family History  Problem Relation Age of Onset   Cancer Sister 4       liver cancer   Diabetes Brother        coma  diabetic    Social History   Socioeconomic History   Marital status: Married    Spouse name: Not on file   Number of children: Not on file   Years of education: Not on file   Highest education level: Not on file  Occupational History   Not on file  Tobacco  Use   Smoking status: Never   Smokeless tobacco: Never  Vaping Use   Vaping status: Never Used  Substance and Sexual Activity   Alcohol use: Yes    Alcohol/week: 0.0 standard drinks of alcohol    Comment: Rarely   Drug use: No   Sexual activity: Yes  Other Topics Concern   Not on file  Social History Narrative   Not on file   Social Determinants of Health   Financial Resource Strain: Low Risk  (07/08/2021)   Overall Financial Resource Strain (CARDIA)    Difficulty of Paying Living Expenses: Not hard at all  Food Insecurity: Not on file  Transportation Needs: Not on file  Physical Activity: Not on file  Stress: Not on file  Social Connections: Not on file  Intimate Partner Violence: Not on file   Review of Systems Smell  is gone No N/V Not much appetite--just had some fries for lunch (couldn't eat the chicken)    Objective:   Physical Exam Constitutional:      Appearance: Normal appearance.  Pulmonary:     Effort: Pulmonary effort is normal. No respiratory distress.  Neurological:     Mental Status: He is alert.            Assessment & Plan:

## 2023-03-03 NOTE — Assessment & Plan Note (Signed)
Would like to consider antiviral treatment Rx paxlovid-normal GFR. Hold silodosin/rosuvastatin while taking. Stop if GI side effects Back in school--did stay out of in person classes Discussed isolation unless next week---1 week with mask after Supportive care---analgesics To ER if worsens or sig SOB

## 2023-09-21 ENCOUNTER — Other Ambulatory Visit: Payer: Self-pay | Admitting: Internal Medicine

## 2023-09-21 DIAGNOSIS — E1169 Type 2 diabetes mellitus with other specified complication: Secondary | ICD-10-CM

## 2023-09-22 ENCOUNTER — Other Ambulatory Visit: Payer: Self-pay | Admitting: Internal Medicine

## 2023-09-22 DIAGNOSIS — E1165 Type 2 diabetes mellitus with hyperglycemia: Secondary | ICD-10-CM

## 2023-10-05 ENCOUNTER — Other Ambulatory Visit: Payer: Self-pay | Admitting: Internal Medicine

## 2023-10-05 DIAGNOSIS — E1169 Type 2 diabetes mellitus with other specified complication: Secondary | ICD-10-CM

## 2023-10-06 ENCOUNTER — Encounter: Payer: Self-pay | Admitting: Internal Medicine

## 2023-10-06 ENCOUNTER — Ambulatory Visit: Payer: Self-pay | Admitting: Internal Medicine

## 2023-10-06 VITALS — BP 106/64 | HR 85 | Ht 68.0 in | Wt 142.2 lb

## 2023-10-06 DIAGNOSIS — E1140 Type 2 diabetes mellitus with diabetic neuropathy, unspecified: Secondary | ICD-10-CM

## 2023-10-06 DIAGNOSIS — I6930 Unspecified sequelae of cerebral infarction: Secondary | ICD-10-CM

## 2023-10-06 DIAGNOSIS — R202 Paresthesia of skin: Secondary | ICD-10-CM

## 2023-10-06 DIAGNOSIS — E785 Hyperlipidemia, unspecified: Secondary | ICD-10-CM

## 2023-10-06 DIAGNOSIS — E1169 Type 2 diabetes mellitus with other specified complication: Secondary | ICD-10-CM

## 2023-10-06 DIAGNOSIS — I10 Essential (primary) hypertension: Secondary | ICD-10-CM | POA: Diagnosis not present

## 2023-10-06 DIAGNOSIS — R5383 Other fatigue: Secondary | ICD-10-CM | POA: Diagnosis not present

## 2023-10-06 DIAGNOSIS — E1165 Type 2 diabetes mellitus with hyperglycemia: Secondary | ICD-10-CM

## 2023-10-06 DIAGNOSIS — Z7985 Long-term (current) use of injectable non-insulin antidiabetic drugs: Secondary | ICD-10-CM

## 2023-10-06 DIAGNOSIS — Z23 Encounter for immunization: Secondary | ICD-10-CM

## 2023-10-06 DIAGNOSIS — Z7984 Long term (current) use of oral hypoglycemic drugs: Secondary | ICD-10-CM

## 2023-10-06 DIAGNOSIS — E11319 Type 2 diabetes mellitus with unspecified diabetic retinopathy without macular edema: Secondary | ICD-10-CM

## 2023-10-06 LAB — COMPREHENSIVE METABOLIC PANEL WITH GFR
ALT: 22 U/L (ref 0–53)
AST: 20 U/L (ref 0–37)
Albumin: 4.2 g/dL (ref 3.5–5.2)
Alkaline Phosphatase: 66 U/L (ref 39–117)
BUN: 16 mg/dL (ref 6–23)
CO2: 32 meq/L (ref 19–32)
Calcium: 9.4 mg/dL (ref 8.4–10.5)
Chloride: 103 meq/L (ref 96–112)
Creatinine, Ser: 0.93 mg/dL (ref 0.40–1.50)
GFR: 86.75 mL/min (ref >60.00)
Glucose, Bld: 98 mg/dL (ref 70–99)
Potassium: 4.2 meq/L (ref 3.5–5.1)
Sodium: 140 meq/L (ref 135–145)
Total Bilirubin: 1.3 mg/dL — ABNORMAL HIGH (ref 0.2–1.2)
Total Protein: 7.1 g/dL (ref 6.0–8.3)

## 2023-10-06 LAB — CBC WITH DIFFERENTIAL/PLATELET
Basophils Absolute: 0 10*3/uL (ref 0.0–0.1)
Basophils Relative: 0.2 % (ref 0.0–3.0)
Eosinophils Absolute: 0 10*3/uL (ref 0.0–0.7)
Eosinophils Relative: 0.4 % (ref 0.0–5.0)
HCT: 50.1 % (ref 39.0–52.0)
Hemoglobin: 16.4 g/dL (ref 13.0–17.0)
Lymphocytes Relative: 34.3 % (ref 12.0–46.0)
Lymphs Abs: 1.6 10*3/uL (ref 0.7–4.0)
MCHC: 32.9 g/dL (ref 30.0–36.0)
MCV: 94.6 fl (ref 78.0–100.0)
Monocytes Absolute: 0.4 10*3/uL (ref 0.1–1.0)
Monocytes Relative: 7.5 % (ref 3.0–12.0)
Neutro Abs: 2.8 10*3/uL (ref 1.4–7.7)
Neutrophils Relative %: 57.6 % (ref 43.0–77.0)
Platelets: 135 10*3/uL — ABNORMAL LOW (ref 150.0–400.0)
RBC: 5.29 Mil/uL (ref 4.22–5.81)
RDW: 14.6 % (ref 11.5–15.5)
WBC: 4.8 10*3/uL (ref 4.0–10.5)

## 2023-10-06 LAB — POCT GLYCOSYLATED HEMOGLOBIN (HGB A1C): Hemoglobin A1C: 9.4 % — AB (ref 4.0–5.6)

## 2023-10-06 LAB — LIPID PANEL
Cholesterol: 146 mg/dL (ref 0–200)
HDL: 46.9 mg/dL (ref >39.00)
LDL Cholesterol: 84 mg/dL (ref 0–99)
NonHDL: 98.71
Total CHOL/HDL Ratio: 3
Triglycerides: 73 mg/dL (ref 0.0–149.0)
VLDL: 14.6 mg/dL (ref 0.0–40.0)

## 2023-10-06 LAB — MICROALBUMIN / CREATININE URINE RATIO
Creatinine,U: 107.3 mg/dL
Microalb Creat Ratio: UNDETERMINED mg/g (ref 0.0–30.0)
Microalb, Ur: <0.7 mg/dL

## 2023-10-06 LAB — LDL CHOLESTEROL, DIRECT: Direct LDL: 82 mg/dL

## 2023-10-06 MED ORDER — OZEMPIC (1 MG/DOSE) 4 MG/3ML ~~LOC~~ SOPN: 1.0000 mg | PEN_INJECTOR | SUBCUTANEOUS | 2 refills | Status: DC

## 2023-10-06 MED ORDER — TELMISARTAN 20 MG PO TABS: 20.0000 mg | ORAL_TABLET | Freq: Every day | ORAL | 1 refills | Status: DC

## 2023-10-06 MED ORDER — ROSUVASTATIN CALCIUM 10 MG PO TABS: 10.0000 mg | ORAL_TABLET | Freq: Every day | ORAL | 1 refills | Status: DC

## 2023-10-06 NOTE — Assessment & Plan Note (Signed)
 Uncontrolled due to being lost to follow up.  CBG placed ,  RTC in 2 weeks for medication adjustment

## 2023-10-06 NOTE — Patient Instructions (Addendum)
   I would like to see you after you have worn the CBG monitor for 2 weeks  for medication adjustments  and every 3 months  until your A1c is  < 7.0   Post prandial sugars should be 160 or less,  fasting should be 130 or less   The tingling in your legs appears to be due to diabetic neuropathy based on EMG results from Feb 2024  No treatment is needed unless the symptoms start to interfere with sleep or activity

## 2023-10-06 NOTE — Progress Notes (Unsigned)
 Subjective:  Patient ID: George Gordon, male    DOB: Jun 04, 1961  Age: 63 y.o. MRN: [id number]  CC: The primary encounter diagnosis was Essential hypertension. Diagnoses of Type 2 diabetes mellitus with retinopathy without macular edema, without long-term current use of insulin, unspecified laterality, unspecified retinopathy severity (HCC), Hyperlipidemia due to type 2 diabetes mellitus (HCC), Other fatigue, Type 2 diabetes mellitus with hyperglycemia, unspecified whether long term insulin use (HCC), and Hyperlipidemia associated with type 2 diabetes mellitus (HCC) were also pertinent to this visit.   HPI Godson Pollan presents for  Chief Complaint  Patient presents with   Medical Management of Chronic Issues   1) Type 2 DM:  patient has been LOST TO FOLLOW UP since Dec 2023 . Taking ozempic and jardiance,  crestor and telmisartan .  Home CBGS have been 121 fasting  and the highest 140.  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    3) parathesias of left leg  diagnosed with polyneuropathy  by abnormal EMGs done by neurology Feb 2024 EMG.  Has been dissatisfied with care at [facility]    Outpatient Medications Prior to Visit  Medication Sig Dispense Refill   aspirin EC 81 MG tablet Take 1 tablet (81 mg total) by mouth daily. Swallow whole. 30 tablet 12   Cetirizine HCl (ZYRTEC ALLERGY) 10 MG CAPS Take 1 capsule (10 mg total) by mouth daily. 90 capsule 3   cholecalciferol (VITAMIN D3) 25 MCG (1000 UNIT) tablet Take 1,000 Units by mouth daily.     glucose blood (CONTOUR NEXT TEST) test strip Use to check blood sugar twice daily 100 each 12   JARDIANCE 25 MG TABS tablet TAKE 1 TABLET BY MOUTH EVERY DAY BEFORE BREAKFAST 90 tablet 0    silodosin (RAPAFLO) 8 MG CAPS capsule Take 8 mg by mouth daily with breakfast.     zinc gluconate 50 MG tablet Take 50 mg by mouth daily.     rosuvastatin (CRESTOR) 10 MG tablet TAKE 1 TABLET BY MOUTH EVERY DAY 30 tablet 0   Semaglutide, 1 MG/DOSE, (OZEMPIC, 1 MG/DOSE,) 4 MG/3ML SOPN Inject 1 mg into the skin once a week. 9 mL 2   telmisartan (MICARDIS) 20 MG tablet TAKE 1 TABLET BY MOUTH EVERY DAY 30 tablet 0   No facility-administered medications prior to visit.    Review of Systems;  Patient denies headache, fevers, malaise, unintentional weight loss, skin rash, eye pain, sinus congestion and sinus pain, sore throat, dysphagia,  hemoptysis , cough, dyspnea, wheezing, chest pain, palpitations, orthopnea, edema, abdominal pain, nausea, melena, diarrhea, constipation, flank pain, dysuria, hematuria, urinary  Frequency, nocturia, numbness, tingling, seizures,  Focal weakness, Loss of consciousness,  Tremor, insomnia, depression, anxiety, and suicidal ideation.      Objective:  BP 106/64   Pulse 85   Ht 5\' 8"  (1.727 m)   Wt 142 lb 3.2 oz (64.5 kg)   SpO2 97%   BMI 21.62 kg/m   BP Readings from Last 3 Encounters:  10/06/23 106/64  07/07/22 126/70  07/01/22 134/70    Wt Readings from Last 3 Encounters:  10/06/23 142 lb 3.2 oz (64.5 kg)  07/07/22 138 lb 12.8 oz (63 kg)  07/01/22  138 lb (62.6 kg)    Physical Exam  Lab Results  Component Value Date   HGBA1C 7.4 (H) 07/07/2022   HGBA1C 7.3 (H) 03/22/2022   HGBA1C 7.1 (H) 11/20/2021    Lab Results  Component Value Date   CREATININE 0.92 07/01/2022   CREATININE 1.12 03/22/2022   CREATININE 1.05 11/20/2021    Lab Results  Component Value Date   WBC 4.6 07/01/2022   HGB 16.1 07/01/2022   HCT 49.5 07/01/2022   PLT 159 07/01/2022   GLUCOSE 170 (H) 07/01/2022   CHOL 131 03/22/2022   TRIG 55.0 03/22/2022   HDL 42.10 03/22/2022   LDLDIRECT 82.0 11/20/2021   LDLCALC 77 03/22/2022   ALT 51 07/07/2022   AST 29 07/07/2022    NA 140 07/01/2022   K 3.9 07/01/2022   CL 108 07/01/2022   CREATININE 0.92 07/01/2022   BUN 15 07/01/2022   CO2 25 07/01/2022   TSH 0.61 07/07/2022   PSA 1.72 11/23/2021   HGBA1C 7.4 (H) 07/07/2022   MICROALBUR <0.7 11/23/2021    MR BRAIN WO CONTRAST Result Date: 07/02/2022 CLINICAL DATA:  Initial evaluation for neuro deficit, stroke suspected. EXAM: MRI HEAD WITHOUT CONTRAST TECHNIQUE: Multiplanar, multiecho pulse sequences of the brain and surrounding structures were obtained without intravenous contrast. COMPARISON:  CT from earlier the same day. FINDINGS: Brain: Cerebral volume within normal limits. Mild scattered patchy T2/FLAIR hyperintensity involving the supratentorial cerebral white matter, most likely related to chronic microvascular ischemic disease, mild for age. Few small remote lacunar infarcts present the bilateral basal ganglia and left thalamus. About No evidence for acute or subacute ischemia. Gray-white matter differentiation maintained. No areas of chronic cortical infarction. No acute intracranial hemorrhage. Single punctate chronic microhemorrhage noted within the pons. Few additional small foci of susceptibility artifact related to parenchymal calcifications and seen on prior head CT. No mass lesion, midline shift or mass effect. No hydrocephalus or extra-axial fluid collection. Pituitary gland and suprasellar region within normal limits. Vascular: Major intracranial vascular flow voids are maintained. Skull and upper cervical spine: Craniocervical junction within normal limits. Bone marrow signal intensity normal. No scalp soft tissue abnormality. Sinuses/Orbits: Prior bilateral ocular lens replacement. Paranasal sinuses are largely clear. No mastoid effusion. Other: None. IMPRESSION: 1. No acute intracranial abnormality. 2. Mild chronic microvascular ischemic disease with a few small remote lacunar infarcts involving the bilateral basal ganglia and left thalamus.  Electronically Signed   By: Rise Mu M.D.   On: 07/02/2022 00:21   MR Cervical Spine Wo Contrast Result Date: 07/01/2022 CLINICAL DATA:  Cervical radiculopathy. Intermittent left arm and left leg numbness for 4 days. EXAM: MRI CERVICAL SPINE WITHOUT CONTRAST TECHNIQUE: Multiplanar, multisequence MR imaging of the cervical spine was performed. No intravenous contrast was administered. COMPARISON:  None Available. FINDINGS: Alignment: Physiologic. Vertebrae: No fracture, evidence of discitis, or bone lesion. Cord: Normal signal and morphology. Posterior Fossa, vertebral arteries, paraspinal tissues: Negative. Disc levels: C2-3: No significant disc bulge. No neural foraminal stenosis. No central canal stenosis. C3-4: No significant disc bulge. No neural foraminal stenosis. No central canal stenosis. C4-5: No significant disc bulge. No neural foraminal stenosis. No central canal stenosis. C5-6: No significant disc bulge. No neural foraminal stenosis. No central canal stenosis. C6-7: No significant disc bulge. No neural foraminal stenosis. No central canal stenosis. C7-T1: No significant disc bulge. No neural foraminal stenosis. No central canal stenosis. IMPRESSION: 1. No significant disc bulge, foraminal stenosis or central canal stenosis of the cervical spine. 2. No acute  osseous injury of the cervical spine. Electronically Signed   By: Larose Hires D.O.   On: 07/01/2022 23:45   CT Head Wo Contrast Result Date: 07/01/2022 CLINICAL DATA:  Neuro deficit, persistent/recurrent, CNS neoplasm suspected Left sided weakness/numbness EXAM: CT HEAD WITHOUT CONTRAST TECHNIQUE: Contiguous axial images were obtained from the base of the skull through the vertex without intravenous contrast. RADIATION DOSE REDUCTION: This exam was performed according to the departmental dose-optimization program which includes automated exposure control, adjustment of the mA and/or kV according to patient size and/or use of  iterative reconstruction technique. COMPARISON:  CT head 07/02/2016. MRI head 02/04/2019. Also, MRI head August 13, 2016. FINDINGS: Brain: No evidence of acute infarction, hemorrhage, hydrocephalus, extra-axial collection or mass lesion/mass effect. Similar appearance of multiple small foci of calcification in the left frontal and parietal lobes. Vascular: No hyperdense vessel. Skull: No acute fracture. Sinuses/Orbits: Clear sinuses.  No acute orbital findings. Other: No mastoid effusions. IMPRESSION: 1. No evidence of acute intracranial abnormality. 2. Similar appearance of multiple small foci of calcification in the left frontal and parietal lobes, further characterized on prior MRIs. Electronically Signed   By: Feliberto Harts M.D.   On: 07/01/2022 15:50    Assessment & Plan:  .Essential hypertension -     Microalbumin / creatinine urine ratio -     Comprehensive metabolic panel with GFR  Type 2 diabetes mellitus with retinopathy without macular edema, without long-term current use of insulin, unspecified laterality, unspecified retinopathy severity (HCC) -     Microalbumin / creatinine urine ratio -     Comprehensive metabolic panel with GFR -     POCT glycosylated hemoglobin (Hb A1C)  Hyperlipidemia due to type 2 diabetes mellitus (HCC) -     Lipid panel -     LDL cholesterol, direct  Other fatigue -     CBC with Differential/Platelet -     TSH  Type 2 diabetes mellitus with hyperglycemia, unspecified whether long term insulin use (HCC)  Hyperlipidemia associated with type 2 diabetes mellitus (HCC) -     Rosuvastatin Calcium; Take 1 tablet (10 mg total) by mouth daily.  Dispense: 90 tablet; Refill: 1  Other orders -     Ozempic (1 MG/DOSE); Inject 1 mg into the skin once a week.  Dispense: 9 mL; Refill: 2 -     Telmisartan; Take 1 tablet (20 mg total) by mouth daily.  Dispense: 90 tablet; Refill: 1     I spent 34 minutes on the day of this face to face encounter reviewing  patient's  most recent visit with cardiology,  nephrology,  and neurology,  prior relevant surgical and non surgical procedures, recent  labs and imaging studies, counseling on weight management,  reviewing the assessment and plan with patient, and post visit ordering and reviewing of  diagnostics and therapeutics with patient  .   Follow-up: No follow-ups on file.   Sherlene Shams, MD

## 2023-10-07 LAB — TSH: TSH: 1.16 u[IU]/mL (ref 0.35–5.50)

## 2023-10-07 NOTE — Assessment & Plan Note (Signed)
 Well controlled on current regimen. Renal function stable, no changes today.  Lab Results  Component Value Date   CREATININE 0.93 10/06/2023   Lab Results  Component Value Date   MICROALBUR <0.7 10/06/2023   MICROALBUR <0.7 11/23/2021

## 2023-10-07 NOTE — Assessment & Plan Note (Signed)
 Noted on most recent MRI.  Has resumed ASA , taking statin and BP is at goal

## 2023-10-07 NOTE — Assessment & Plan Note (Signed)
 Foot exam done .  No callouses .  Reviewed EMG study done Feb 2024

## 2023-10-07 NOTE — Assessment & Plan Note (Addendum)
 MRI brain and cervical spine reviewed,  no  report of lesions suggestive of MS, and no cervical disk disease.  Referred  to neurology for further evaluation,  EMG studies done  n Feb 2024: mild generalized polyneuropathy in the lower extremities.

## 2023-10-09 ENCOUNTER — Encounter: Payer: Self-pay | Admitting: Internal Medicine

## 2023-10-10 ENCOUNTER — Telehealth: Payer: Self-pay | Admitting: Pharmacy Technician

## 2023-10-10 ENCOUNTER — Other Ambulatory Visit (HOSPITAL_COMMUNITY): Payer: Self-pay

## 2023-10-10 NOTE — Telephone Encounter (Signed)
 noted

## 2023-10-10 NOTE — Telephone Encounter (Signed)
 Pharmacy Patient Advocate Encounter   Received notification from CoverMyMeds that prior authorization for Ozempic (1 MG/DOSE) 4MG /3ML pen-injectors is required/requested.   Insurance verification completed.   The patient is insured through [facility] .   Per test claim: [id number]  SUBMITTED AND PENDING

## 2023-10-11 ENCOUNTER — Other Ambulatory Visit (HOSPITAL_COMMUNITY): Payer: Self-pay

## 2023-10-11 NOTE — Telephone Encounter (Signed)
 Pharmacy Patient Advocate Encounter  Received notification from [facility] that Prior Authorization for Ozempic (1 MG/DOSE) 4MG /3ML pen-injectors has been APPROVED from 09/30/23 to 10/10/26. Unable to obtain price due to refill too soon rejection, last fill date 10/10/23 next available fill date04/25/25   PA #/Case ID/Reference #: [id number]

## 2023-10-12 NOTE — Telephone Encounter (Signed)
 PLEASE BE ADVISED LETTER OF APPROVAL IS SCANNED IN MEDIA OF CHART

## 2023-10-20 ENCOUNTER — Ambulatory Visit: Admitting: Internal Medicine

## 2023-10-20 ENCOUNTER — Encounter: Payer: Self-pay | Admitting: Internal Medicine

## 2023-10-20 VITALS — BP 116/56 | HR 82 | Ht 68.0 in | Wt 143.4 lb

## 2023-10-20 DIAGNOSIS — E1165 Type 2 diabetes mellitus with hyperglycemia: Secondary | ICD-10-CM

## 2023-10-20 DIAGNOSIS — Z7984 Long term (current) use of oral hypoglycemic drugs: Secondary | ICD-10-CM | POA: Diagnosis not present

## 2023-10-20 DIAGNOSIS — Z7985 Long-term (current) use of injectable non-insulin antidiabetic drugs: Secondary | ICD-10-CM | POA: Diagnosis not present

## 2023-10-20 LAB — MICROALBUMIN / CREATININE URINE RATIO
Creatinine,U: 93.4 mg/dL
Microalb Creat Ratio: UNDETERMINED mg/g (ref 0.0–30.0)
Microalb, Ur: <0.7 mg/dL

## 2023-10-20 MED ORDER — FREESTYLE LIBRE 3 SENSOR MISC: 11 refills | Status: DC

## 2023-10-20 NOTE — Assessment & Plan Note (Addendum)
 BS are improving and he has identified dietary problems .  He will continue monitoring for 2 more weeks after discontinuing sweet tea.  No changes today .  Continu ozempic 1 mg and 25 mg Jardiance

## 2023-10-20 NOTE — Progress Notes (Signed)
 Subjective:  Patient ID: George Gordon, male    DOB: 08/15/1960  Age: 63 y.o. MRN: [id number]  CC: The encounter diagnosis was Type 2 diabetes mellitus with hyperglycemia, without long-term current use of insulin (HCC).   HPI George Gordon presents for  Chief Complaint  Patient presents with   Medical Management of Chronic Issues    2 week follow up    Type 2 DM, uncontrolled: patient was seen two weeks ago after being lost to follow up.  A CBG monitor was placed on his arm and he returns today for medication adjustment.  I have downloaded and reviewed the data from patient's continuous blood glucose monitor. Patient's  sugars have been  IN RANGE    60 % OF THE TIME,   BELOW RANGE  0  % of the time.  And ABOVE RANGE  40 % OF THE TIME .  His average BS is 174,  His current Medication regimen  and diet were reviewed. He has been drinking  tea sweetened with honey throughout the day for the past several days due to sore throat . He has noted fluctuations in BS upwasrd during periods of fasting and has concerns about these trends.   Lab Results  Component Value Date   HGBA1C 9.4 (A) 10/06/2023      Outpatient Medications Prior to Visit  Medication Sig Dispense Refill   aspirin EC 81 MG tablet Take 1 tablet (81 mg total) by mouth daily. Swallow whole. 30 tablet 12   Cetirizine HCl (ZYRTEC ALLERGY) 10 MG CAPS Take 1 capsule (10 mg total) by mouth daily. 90 capsule 3   cholecalciferol (VITAMIN D3) 25 MCG (1000 UNIT) tablet Take 1,000 Units by mouth daily.     glucose blood (CONTOUR NEXT TEST) test strip Use to check blood sugar twice daily 100 each 12   JARDIANCE 25 MG TABS tablet TAKE 1 TABLET BY MOUTH EVERY DAY BEFORE BREAKFAST 90 tablet 0   rosuvastatin (CRESTOR) 10 MG tablet Take 1 tablet (10 mg total) by mouth daily. 90 tablet 1   Semaglutide, 1 MG/DOSE, (OZEMPIC, 1 MG/DOSE,) 4 MG/3ML SOPN Inject 1 mg into the skin once a week. 9 mL 2   silodosin (RAPAFLO) 8 MG CAPS capsule Take 8  mg by mouth daily with breakfast.     telmisartan (MICARDIS) 20 MG tablet Take 1 tablet (20 mg total) by mouth daily. 90 tablet 1   zinc gluconate 50 MG tablet Take 50 mg by mouth daily.     No facility-administered medications prior to visit.    Review of Systems;  Patient denies headache, fevers, malaise, unintentional weight loss, skin rash, eye pain, sinus congestion and sinus pain, sore throat, dysphagia,  hemoptysis , cough, dyspnea, wheezing, chest pain, palpitations, orthopnea, edema, abdominal pain, nausea, melena, diarrhea, constipation, flank pain, dysuria, hematuria, urinary  Frequency, nocturia, numbness, tingling, seizures,  Focal weakness, Loss of consciousness,  Tremor, insomnia, depression, anxiety, and suicidal ideation.      Objective:  BP (!) 116/56   Pulse 82   Ht 5\' 8"  (1.727 m)   Wt 143 lb 6.4 oz (65 kg)   SpO2 98%   BMI 21.80 kg/m   BP Readings from Last 3 Encounters:  10/20/23 (!) 116/56  10/06/23 106/64  07/07/22 126/70    Wt Readings from Last 3 Encounters:  10/20/23 143 lb 6.4 oz (65 kg)  10/06/23 142 lb 3.2 oz (64.5 kg)  07/07/22 138 lb 12.8 oz (63 kg)  Physical Exam Vitals reviewed.  Constitutional:      General: He is not in acute distress.    Appearance: Normal appearance. He is normal weight. He is not ill-appearing, toxic-appearing or diaphoretic.  HENT:     Head: Normocephalic.  Eyes:     General: No scleral icterus.       Right eye: No discharge.        Left eye: No discharge.     Conjunctiva/sclera: Conjunctivae normal.  Cardiovascular:     Rate and Rhythm: Normal rate and regular rhythm.     Heart sounds: Normal heart sounds.  Pulmonary:     Effort: Pulmonary effort is normal. No respiratory distress.     Breath sounds: Normal breath sounds.  Musculoskeletal:        General: Normal range of motion.     Cervical back: Normal range of motion.  Skin:    General: Skin is warm and dry.  Neurological:     General: No focal  deficit present.     Mental Status: He is alert and oriented to person, place, and time. Mental status is at baseline.  Psychiatric:        Mood and Affect: Mood normal.        Behavior: Behavior normal.        Thought Content: Thought content normal.        Judgment: Judgment normal.    Lab Results  Component Value Date   HGBA1C 9.4 (A) 10/06/2023   HGBA1C 7.4 (H) 07/07/2022   HGBA1C 7.3 (H) 03/22/2022    Lab Results  Component Value Date   CREATININE 0.93 10/06/2023   CREATININE 0.92 07/01/2022   CREATININE 1.12 03/22/2022    Lab Results  Component Value Date   WBC 4.8 10/06/2023   HGB 16.4 10/06/2023   HCT 50.1 10/06/2023   PLT 135.0 (L) 10/06/2023   GLUCOSE 98 10/06/2023   CHOL 146 10/06/2023   TRIG 73.0 10/06/2023   HDL 46.90 10/06/2023   LDLDIRECT 82.0 10/06/2023   LDLCALC 84 10/06/2023   ALT 22 10/06/2023   AST 20 10/06/2023   NA 140 10/06/2023   K 4.2 10/06/2023   CL 103 10/06/2023   CREATININE 0.93 10/06/2023   BUN 16 10/06/2023   CO2 32 10/06/2023   TSH 1.16 10/06/2023   PSA 1.72 11/23/2021   HGBA1C 9.4 (A) 10/06/2023   MICROALBUR <0.7 10/20/2023    MR BRAIN WO CONTRAST Result Date: 07/02/2022 CLINICAL DATA:  Initial evaluation for neuro deficit, stroke suspected. EXAM: MRI HEAD WITHOUT CONTRAST TECHNIQUE: Multiplanar, multiecho pulse sequences of the brain and surrounding structures were obtained without intravenous contrast. COMPARISON:  CT from earlier the same day. FINDINGS: Brain: Cerebral volume within normal limits. Mild scattered patchy T2/FLAIR hyperintensity involving the supratentorial cerebral white matter, most likely related to chronic microvascular ischemic disease, mild for age. Few small remote lacunar infarcts present the bilateral basal ganglia and left thalamus. About No evidence for acute or subacute ischemia. Gray-white matter differentiation maintained. No areas of chronic cortical infarction. No acute intracranial hemorrhage. Single  punctate chronic microhemorrhage noted within the pons. Few additional small foci of susceptibility artifact related to parenchymal calcifications and seen on prior head CT. No mass lesion, midline shift or mass effect. No hydrocephalus or extra-axial fluid collection. Pituitary gland and suprasellar region within normal limits. Vascular: Major intracranial vascular flow voids are maintained. Skull and upper cervical spine: Craniocervical junction within normal limits. Bone marrow signal intensity normal. No scalp soft  tissue abnormality. Sinuses/Orbits: Prior bilateral ocular lens replacement. Paranasal sinuses are largely clear. No mastoid effusion. Other: None. IMPRESSION: 1. No acute intracranial abnormality. 2. Mild chronic microvascular ischemic disease with a few small remote lacunar infarcts involving the bilateral basal ganglia and left thalamus. Electronically Signed   By: Rise Mu M.D.   On: 07/02/2022 00:21   MR Cervical Spine Wo Contrast Result Date: 07/01/2022 CLINICAL DATA:  Cervical radiculopathy. Intermittent left arm and left leg numbness for 4 days. EXAM: MRI CERVICAL SPINE WITHOUT CONTRAST TECHNIQUE: Multiplanar, multisequence MR imaging of the cervical spine was performed. No intravenous contrast was administered. COMPARISON:  None Available. FINDINGS: Alignment: Physiologic. Vertebrae: No fracture, evidence of discitis, or bone lesion. Cord: Normal signal and morphology. Posterior Fossa, vertebral arteries, paraspinal tissues: Negative. Disc levels: C2-3: No significant disc bulge. No neural foraminal stenosis. No central canal stenosis. C3-4: No significant disc bulge. No neural foraminal stenosis. No central canal stenosis. C4-5: No significant disc bulge. No neural foraminal stenosis. No central canal stenosis. C5-6: No significant disc bulge. No neural foraminal stenosis. No central canal stenosis. C6-7: No significant disc bulge. No neural foraminal stenosis. No central  canal stenosis. C7-T1: No significant disc bulge. No neural foraminal stenosis. No central canal stenosis. IMPRESSION: 1. No significant disc bulge, foraminal stenosis or central canal stenosis of the cervical spine. 2. No acute osseous injury of the cervical spine. Electronically Signed   By: Larose Hires D.O.   On: 07/01/2022 23:45   CT Head Wo Contrast Result Date: 07/01/2022 CLINICAL DATA:  Neuro deficit, persistent/recurrent, CNS neoplasm suspected Left sided weakness/numbness EXAM: CT HEAD WITHOUT CONTRAST TECHNIQUE: Contiguous axial images were obtained from the base of the skull through the vertex without intravenous contrast. RADIATION DOSE REDUCTION: This exam was performed according to the departmental dose-optimization program which includes automated exposure control, adjustment of the mA and/or kV according to patient size and/or use of iterative reconstruction technique. COMPARISON:  CT head 07/02/2016. MRI head 02/04/2019. Also, MRI head August 13, 2016. FINDINGS: Brain: No evidence of acute infarction, hemorrhage, hydrocephalus, extra-axial collection or mass lesion/mass effect. Similar appearance of multiple small foci of calcification in the left frontal and parietal lobes. Vascular: No hyperdense vessel. Skull: No acute fracture. Sinuses/Orbits: Clear sinuses.  No acute orbital findings. Other: No mastoid effusions. IMPRESSION: 1. No evidence of acute intracranial abnormality. 2. Similar appearance of multiple small foci of calcification in the left frontal and parietal lobes, further characterized on prior MRIs. Electronically Signed   By: Feliberto Harts M.D.   On: 07/01/2022 15:50    Assessment & Plan:  .Type 2 diabetes mellitus with hyperglycemia, without long-term current use of insulin (HCC) Assessment & Plan: BS are improving and he has identified dietary problems .  He will continue monitoring for 2 more weeks after discontinuing sweet tea.  No changes today .  Continu  ozempic 1 mg and 25 mg Jardiance   Orders: -     Microalbumin / creatinine urine ratio -     FreeStyle Libre 3 Sensor; Place 1 sensor on the skin every 14 days. Use to check glucose continuously  Dispense: 2 each; Refill: 11 -     Hemoglobin A1c; Future -     Comprehensive metabolic panel with GFR; Future    Follow-up: Return in about 3 months (around 01/19/2024) for follow up diabetes.   Sherlene Shams, MD

## 2023-10-20 NOTE — Patient Instructions (Addendum)
 For your ALLERGIES:  ADD A 2 ND DOSE OF ZYRTEC   10 MG EVERY 12 HOURS  FLUSH  SINUSES WITH SALT WATER AFTER BEING OUTSIDE   ADD FLONASE NASAL SPRAY (STEROID) OR ASTEPRO  NASAL SPRAY (ANTIHISTAMINE)  IF YOUR ALLERGIES AREN'T CONTROLLED ON TWICE DAILY ZYRTEC   OK TO USE  AFRIN FOR MAXIMUM 5 DAYS IN A ROW  BUT THIS WILL ONLY TREAT CONGESTION,  NOT THE ALLERGY   SEND ME A MYCHART MESSAGE IN 2 WEEKS SO I CAN DOWNLOAD YOUR SUGARS AGAIN AND MAKE SUGGESTIONS .  DO NOT USE HONEY IN YOUR TEA FOR THE NEXT 2 WEEKS

## 2023-10-21 ENCOUNTER — Encounter: Payer: Self-pay | Admitting: Internal Medicine

## 2024-02-29 ENCOUNTER — Other Ambulatory Visit: Payer: Self-pay | Admitting: Internal Medicine

## 2024-02-29 DIAGNOSIS — E1165 Type 2 diabetes mellitus with hyperglycemia: Secondary | ICD-10-CM

## 2024-04-14 ENCOUNTER — Other Ambulatory Visit: Payer: Self-pay | Admitting: Internal Medicine

## 2024-04-14 DIAGNOSIS — E1169 Type 2 diabetes mellitus with other specified complication: Secondary | ICD-10-CM

## 2024-05-20 ENCOUNTER — Other Ambulatory Visit: Payer: Self-pay | Admitting: Internal Medicine

## 2024-05-20 DIAGNOSIS — E1165 Type 2 diabetes mellitus with hyperglycemia: Secondary | ICD-10-CM

## 2024-05-29 ENCOUNTER — Other Ambulatory Visit: Payer: Self-pay | Admitting: Internal Medicine

## 2024-05-29 NOTE — Telephone Encounter (Signed)
 open in error

## 2024-08-01 ENCOUNTER — Telehealth: Admitting: Internal Medicine

## 2024-08-01 ENCOUNTER — Telehealth (INDEPENDENT_AMBULATORY_CARE_PROVIDER_SITE_OTHER): Payer: Self-pay

## 2024-08-01 ENCOUNTER — Ambulatory Visit

## 2024-08-01 VITALS — Ht 68.0 in | Wt 143.0 lb

## 2024-08-01 DIAGNOSIS — R0989 Other specified symptoms and signs involving the circulatory and respiratory systems: Secondary | ICD-10-CM

## 2024-08-01 DIAGNOSIS — R519 Headache, unspecified: Secondary | ICD-10-CM

## 2024-08-01 DIAGNOSIS — B349 Viral infection, unspecified: Secondary | ICD-10-CM | POA: Diagnosis not present

## 2024-08-01 DIAGNOSIS — R059 Cough, unspecified: Secondary | ICD-10-CM | POA: Diagnosis not present

## 2024-08-01 DIAGNOSIS — E1165 Type 2 diabetes mellitus with hyperglycemia: Secondary | ICD-10-CM | POA: Diagnosis not present

## 2024-08-01 DIAGNOSIS — J069 Acute upper respiratory infection, unspecified: Secondary | ICD-10-CM

## 2024-08-01 LAB — POCT INFLUENZA A/B
Influenza A, POC: NEGATIVE
Influenza B, POC: NEGATIVE

## 2024-08-01 LAB — POC COVID19 BINAXNOW: SARS Coronavirus 2 Ag: NEGATIVE

## 2024-08-01 NOTE — Telephone Encounter (Signed)
 Called Patient per Dr. Narendra trying to get him scheduled for a nurse visit today to test for Flu & covid. Please schedule patient if he calls back.

## 2024-08-01 NOTE — Assessment & Plan Note (Signed)
-   Patient was noted to have elevated blood sugars today up to 246 -He attributes this to drinking tea with sugar in it to help with his URI symptoms -His last visit with his PCP was in April of last year and his last A1c was in the nines -He has stopped taking the Ozempic  secondary to side effects -Patient was instructed to follow-up with his PCP (has an appointment to follow-up with her in March) -No further workup at this time

## 2024-08-01 NOTE — Assessment & Plan Note (Signed)
-   Patient presents today via video visit for nasal congestion, dry cough and occasional headaches and myalgias since yesterday -Denies any shortness of breath - No visible respiratory distress noted on video visit -I suspect patient likely has a viral URI that is causing his symptoms -Given that he is over 65 and has diabetes he would qualify for Tamiflu /Xofluza if he has a flu or Paxlovid  if he does have COVID I have asked him to come in for a quick nurses visit to get these test done -Continue conservative treatment including nasal saline rinses, Nettie pot, warm showers with steam inhalation, Robitussin/Mucinex  for cough (sugar-free) -Further treatment will be dependent on the results of his  flu and COVID test -No further workup at this time

## 2024-08-01 NOTE — Progress Notes (Signed)
 Virtual Visit via Video Note  I connected with George Gordon on 08/01/24 at  1:00 PM EST by a video enabled telemedicine application and verified that I am speaking with the correct person using two identifiers.  Location patient: home Location provider:work or home office Persons participating in the virtual visit: patient, provider  I discussed the limitations of evaluation and management by telemedicine and the availability of in person appointments. The patient expressed understanding and agreed to proceed.   HPI:  Patient is in today for nasal congestion, cough and occasional headaches beginning yesterday.  Patient also complains of some associated mild chills.  Denies any fevers or chills.  No shortness of breath.  Cough is nonproductive.  No chest pain.  Patient does note that his blood sugars are elevated to 246 this morning which he attributes to drinking tea with sugar in it.  He also states that he stopped taking the Ozempic  secondary to side effects.  His last visit with his PCP was in April of last year and he has a follow-up with her in March.  ROS: See pertinent positives and negatives per HPI.  Past Medical History:  Diagnosis Date   Diabetes mellitus without complication (HCC)    Facial trauma, sequela 08/07/2016   Hyperlipidemia    Hypertension     Past Surgical History:  Procedure Laterality Date   CATARACT EXTRACTION W/PHACO Right 12/21/2016   Procedure: CATARACT EXTRACTION PHACO AND INTRAOCULAR LENS PLACEMENT (IOC);  Surgeon: Jaye Fallow, MD;  Location: [facility] ORS;  Service: Ophthalmology;  Laterality: Right;  US  00:27 AP% 11.3 CDE 3.08 fluid pack lot # [id number]   CATARACT EXTRACTION W/PHACO Left 01/11/2017   Procedure: CATARACT EXTRACTION PHACO AND INTRAOCULAR LENS PLACEMENT (IOC);  Surgeon: Jaye Fallow, MD;  Location: [facility];  Service: Ophthalmology;  Laterality: Left;  US   00:30 AP% 13.0 CDE 3.93 Fluid pack lot # [id number]   COLONOSCOPY      COLONOSCOPY WITH PROPOFOL  N/A 12/09/2021   Procedure: COLONOSCOPY WITH PROPOFOL ;  Surgeon: Therisa Bi, MD;  Location: [facility];  Service: Gastroenterology;  Laterality: N/A;   lasik     NO PAST SURGERIES      Family History  Problem Relation Age of Onset   Cancer Sister 28       liver cancer   Diabetes Brother        coma  diabetic      Current Medications[1]  EXAM:  VITALS per patient if applicable:  GENERAL: alert, oriented, appears well and in no acute distress  HEENT: atraumatic, conjunttiva clear, no obvious abnormalities on inspection of external nose and ears  NECK: normal movements of the head and neck  LUNGS: on inspection no signs of respiratory distress, breathing rate appears normal, no obvious gross SOB, gasping or wheezing  CV: no obvious cyanosis  MS: moves all visible extremities without noticeable abnormality  PSYCH/NEURO: pleasant and cooperative, no obvious depression or anxiety, speech and thought processing grossly intact  ASSESSMENT AND PLAN:  Discussed the following assessment and plan:  Viral upper respiratory tract infection  Type 2 diabetes mellitus with hyperglycemia, unspecified whether long term insulin use (HCC)     I discussed the assessment and treatment plan with the patient. The patient was provided an opportunity to ask questions and all were answered. The patient agreed with the plan and demonstrated an understanding of the instructions.   The patient was advised to call back or seek an in-person evaluation if the symptoms worsen or if the  condition fails to improve as anticipated.     Aylissa Heinemann, MD      [1]  Current Outpatient Medications:    aspirin  EC 81 MG tablet, Take 1 tablet (81 mg total) by mouth daily. Swallow whole., Disp: 30 tablet, Rfl: 12   Cetirizine  HCl (ZYRTEC  ALLERGY ) 10 MG CAPS, Take 1 capsule (10 mg total) by mouth daily., Disp: 90 capsule, Rfl: 3   cholecalciferol (VITAMIN D3) 25 MCG  (1000 UNIT) tablet, Take 1,000 Units by mouth daily., Disp: , Rfl:    CONTOUR NEXT TEST test strip, USE TO CHECK BLOOD SUGAR TWICE DAILY, Disp: 100 strip, Rfl: 12   JARDIANCE  25 MG TABS tablet, TAKE 1 TABLET BY MOUTH EVERY DAY BEFORE BREAKFAST, Disp: 90 tablet, Rfl: 0   rosuvastatin  (CRESTOR ) 10 MG tablet, TAKE 1 TABLET BY MOUTH EVERY DAY, Disp: 90 tablet, Rfl: 1   silodosin (RAPAFLO) 8 MG CAPS capsule, Take 8 mg by mouth daily with breakfast., Disp: , Rfl:    telmisartan  (MICARDIS ) 20 MG tablet, TAKE 1 TABLET BY MOUTH EVERY DAY, Disp: 90 tablet, Rfl: 0   zinc gluconate 50 MG tablet, Take 50 mg by mouth daily., Disp: , Rfl:

## 2024-08-15 ENCOUNTER — Telehealth: Payer: Self-pay

## 2024-08-15 NOTE — Telephone Encounter (Signed)
 Copied from CRM [id number]. Topic: Clinical - Medical Advice >> Aug 15, 2024 11:38 AM George Gordon wrote: Reason for CRM: Patient was seen on 1/21 via telehealth with Dr Onesimo- came into office tested negative for both flu and covid tests in office. He said he was sick for 2 weeks and is asking for a note for school for being out of classes  1/21 -2/2.SABRASABRAPlease contact patient [phone]

## 2024-08-15 NOTE — Telephone Encounter (Signed)
 School note wrote for Patient and Patient is aware.

## 2024-09-17 ENCOUNTER — Encounter: Admitting: Internal Medicine
